# Patient Record
Sex: Female | Born: 1970 | Race: White | Hispanic: No | Smoking: Former smoker
Health system: Southern US, Community
[De-identification: ages and names within clinical notes are randomized; demographics above are authoritative.]

## PROBLEM LIST (undated history)

## (undated) DIAGNOSIS — M1990 Unspecified osteoarthritis, unspecified site: Secondary | ICD-10-CM

## (undated) DIAGNOSIS — M069 Rheumatoid arthritis, unspecified: Secondary | ICD-10-CM

## (undated) HISTORY — PX: ABLATION: SHX5711

## (undated) HISTORY — DX: Rheumatoid arthritis, unspecified: M06.9

## (undated) HISTORY — DX: Unspecified osteoarthritis, unspecified site: M19.90

---

## 2015-01-10 ENCOUNTER — Ambulatory Visit: Payer: Self-pay | Admitting: Internal Medicine

## 2015-07-23 ENCOUNTER — Other Ambulatory Visit (HOSPITAL_COMMUNITY): Payer: Self-pay | Admitting: Rheumatology

## 2015-07-23 ENCOUNTER — Ambulatory Visit (HOSPITAL_COMMUNITY)
Admission: RE | Admit: 2015-07-23 | Discharge: 2015-07-23 | Disposition: A | Discharge status: Home / Self Care | Payer: PRIVATE HEALTH INSURANCE | Source: Ambulatory Visit | Attending: Rheumatology | Admitting: Rheumatology

## 2015-07-23 DIAGNOSIS — Z9225 Personal history of immunosupression therapy: Secondary | ICD-10-CM

## 2016-08-17 ENCOUNTER — Telehealth: Payer: Self-pay | Admitting: Radiology

## 2016-08-17 DIAGNOSIS — M0609 Rheumatoid arthritis without rheumatoid factor, multiple sites: Secondary | ICD-10-CM

## 2016-08-17 MED ORDER — ADALIMUMAB 40 MG/0.8ML ~~LOC~~ AJKT: 1.0000 "pen " | AUTO-INJECTOR | SUBCUTANEOUS | 2 refills | Status: DC

## 2016-08-17 NOTE — Telephone Encounter (Signed)
Last visit 05/05/16 Next visit 10/05/17 Labs 07/24/16 TB neg 07/24/16 Ok to refill Humira per SD

## 2016-08-17 NOTE — Telephone Encounter (Signed)
Refill request received via fax for Humira

## 2016-10-01 ENCOUNTER — Other Ambulatory Visit: Payer: Self-pay | Admitting: *Deleted

## 2016-10-01 DIAGNOSIS — M0609 Rheumatoid arthritis without rheumatoid factor, multiple sites: Secondary | ICD-10-CM

## 2016-10-01 DIAGNOSIS — Z79899 Other long term (current) drug therapy: Secondary | ICD-10-CM | POA: Insufficient documentation

## 2016-10-01 DIAGNOSIS — M722 Plantar fascial fibromatosis: Secondary | ICD-10-CM | POA: Insufficient documentation

## 2016-10-01 DIAGNOSIS — E559 Vitamin D deficiency, unspecified: Secondary | ICD-10-CM | POA: Insufficient documentation

## 2016-10-01 MED ORDER — ADALIMUMAB 40 MG/0.8ML ~~LOC~~ AJKT: 1.0000 "pen " | AUTO-INJECTOR | SUBCUTANEOUS | 2 refills | Status: DC

## 2016-10-01 NOTE — Telephone Encounter (Signed)
ok 

## 2016-10-01 NOTE — Telephone Encounter (Signed)
Prescription refill request received via fax for Humira  Last visit 05/05/16 Next visit 10/05/17 Labs 07/24/16 TB neg 07/24/16  Okay to refill Humira?

## 2016-10-05 ENCOUNTER — Encounter: Payer: Self-pay | Admitting: Rheumatology

## 2016-10-05 ENCOUNTER — Ambulatory Visit (INDEPENDENT_AMBULATORY_CARE_PROVIDER_SITE_OTHER): Payer: PRIVATE HEALTH INSURANCE | Admitting: Rheumatology

## 2016-10-05 VITALS — BP 110/60 | HR 78 | Ht 61.0 in | Wt 128.0 lb

## 2016-10-05 DIAGNOSIS — M722 Plantar fascial fibromatosis: Secondary | ICD-10-CM

## 2016-10-05 DIAGNOSIS — Z79899 Other long term (current) drug therapy: Secondary | ICD-10-CM | POA: Diagnosis not present

## 2016-10-05 DIAGNOSIS — M25511 Pain in right shoulder: Secondary | ICD-10-CM

## 2016-10-05 DIAGNOSIS — M0609 Rheumatoid arthritis without rheumatoid factor, multiple sites: Secondary | ICD-10-CM

## 2016-10-05 DIAGNOSIS — E559 Vitamin D deficiency, unspecified: Secondary | ICD-10-CM | POA: Diagnosis not present

## 2016-10-05 DIAGNOSIS — M25571 Pain in right ankle and joints of right foot: Secondary | ICD-10-CM | POA: Diagnosis not present

## 2016-10-05 MED ORDER — LIDOCAINE HCL 1 % IJ SOLN
1.0000 mL | INTRAMUSCULAR | Status: AC | PRN
  Administered 2016-10-05: 1 mL

## 2016-10-05 MED ORDER — DICLOFENAC SODIUM 1 % TD GEL: TRANSDERMAL | 3 refills | Status: DC

## 2016-10-05 MED ORDER — TRIAMCINOLONE ACETONIDE 40 MG/ML IJ SUSP
40.0000 mg | INTRAMUSCULAR | Status: AC | PRN
  Administered 2016-10-05: 40 mg via INTRA_ARTICULAR

## 2016-10-05 MED ORDER — METHOTREXATE 2.5 MG PO TABS
17.5000 mg | ORAL_TABLET | ORAL | 0 refills | Status: DC
Start: 2016-10-05 — End: 2017-01-03

## 2016-10-05 MED ORDER — ADALIMUMAB 40 MG/0.8ML ~~LOC~~ AJKT: 1.0000 "pen " | AUTO-INJECTOR | SUBCUTANEOUS | 0 refills | Status: DC

## 2016-10-05 NOTE — Patient Instructions (Addendum)
===============  #5 appointment for ultrasound of the bilateral feet and ankles with focus on the right lateral ankle secondary to ongoing chronic pain to the right lateral ankle joint.  ===================  Standing Labs We placed an order today for your standing lab work.    Please come back and get your standing labs in 2 weeks , then q 3 months.  We have open lab Monday through Friday from 8:30-11:30 AM and 1:30-4 PM at the office of Dr. Arbutus Ped, PA.   The office is located at [address], [address], [place], [place] [address] No appointment is necessary.   Labs are drawn by [facility].  You may receive a bill from [facility] for your lab work.   =====================   Shoulder Exercises Ask your health care provider which exercises are safe for you. Do exercises exactly as told by your health care provider and adjust them as directed. It is normal to feel mild stretching, pulling, tightness, or discomfort as you do these exercises, but you should stop right away if you feel sudden pain or your pain gets worse.Do not begin these exercises until told by your health care provider. RANGE OF MOTION EXERCISES  These exercises warm up your muscles and joints and improve the movement and flexibility of your shoulder. These exercises also help to relieve pain, numbness, and tingling. These exercises involve stretching your injured shoulder directly. Exercise A: Pendulum  1. Stand near a wall or a surface that you can hold onto for balance. 2. Bend at the waist and let your left / right arm hang straight down. Use your other arm to support you. Keep your back straight and do not lock your knees. 3. Relax your left / right arm and shoulder muscles, and move your hips and your trunk so your left / right arm swings freely. Your arm should swing because of the motion of your body, not because you are using your arm or shoulder muscles. 4. Keep moving your body so your arm  swings in the following directions, as told by your health care provider:  Side to side.  Forward and backward.  In clockwise and counterclockwise circles. 5. Continue each motion for __________ seconds, or for as long as told by your health care provider. 6. Slowly return to the starting position. Repeat __________ times. Complete this exercise __________ times a day. Exercise B:Flexion, Standing  1. Stand and hold a broomstick, a cane, or a similar object. Place your hands a little more than shoulder-width apart on the object. Your left / right hand should be palm-up, and your other hand should be palm-down. 2. Keep your elbow straight and keep your shoulder muscles relaxed. Push the stick down with your healthy arm to raise your left / right arm in front of your body, and then over your head until you feel a stretch in your shoulder.  Avoid shrugging your shoulder while you raise your arm. Keep your shoulder blade tucked down toward the middle of your back. 3. Hold for __________ seconds. 4. Slowly return to the starting position. Repeat __________ times. Complete this exercise __________ times a day. Exercise C: Abduction, Standing 1. Stand and hold a broomstick, a cane, or a similar object. Place your hands a little more than shoulder-width apart on the object. Your left / right hand should be palm-up, and your other hand should be palm-down. 2. While keeping your elbow straight and your shoulder muscles relaxed, push the stick across your body  toward your left / right side. Raise your left / right arm to the side of your body and then over your head until you feel a stretch in your shoulder.  Do not raise your arm above shoulder height, unless your health care provider tells you to do that.  Avoid shrugging your shoulder while you raise your arm. Keep your shoulder blade tucked down toward the middle of your back. 3. Hold for __________ seconds. 4. Slowly return to the starting  position. Repeat __________ times. Complete this exercise __________ times a day. Exercise D:Internal Rotation  1. Place your left / right hand behind your back, palm-up. 2. Use your other hand to dangle an exercise band, a towel, or a similar object over your shoulder. Grasp the band with your left / right hand so you are holding onto both ends. 3. Gently pull up on the band until you feel a stretch in the front of your left / right shoulder.  Avoid shrugging your shoulder while you raise your arm. Keep your shoulder blade tucked down toward the middle of your back. 4. Hold for __________ seconds. 5. Release the stretch by letting go of the band and lowering your hands. Repeat __________ times. Complete this exercise __________ times a day. STRETCHING EXERCISES  These exercises warm up your muscles and joints and improve the movement and flexibility of your shoulder. These exercises also help to relieve pain, numbness, and tingling. These exercises are done using your healthy shoulder to help stretch the muscles of your injured shoulder. Exercise E: Research officer, political partyCorner Stretch (External Rotation and Abduction)  1. Stand in a doorway with one of your feet slightly in front of the other. This is called a staggered stance. If you cannot reach your forearms to the door frame, stand facing a corner of a room. 2. Choose one of the following positions as told by your health care provider:  Place your hands and forearms on the door frame above your head.  Place your hands and forearms on the door frame at the height of your head.  Place your hands on the door frame at the height of your elbows. 3. Slowly move your weight onto your front foot until you feel a stretch across your chest and in the front of your shoulders. Keep your head and chest upright and keep your abdominal muscles tight. 4. Hold for __________ seconds. 5. To release the stretch, shift your weight to your back foot. Repeat __________ times.  Complete this stretch __________ times a day. Exercise F:Extension, Standing 1. Stand and hold a broomstick, a cane, or a similar object behind your back.  Your hands should be a little wider than shoulder-width apart.  Your palms should face away from your back. 2. Keeping your elbows straight and keeping your shoulder muscles relaxed, move the stick away from your body until you feel a stretch in your shoulder.  Avoid shrugging your shoulders while you move the stick. Keep your shoulder blade tucked down toward the middle of your back. 3. Hold for __________ seconds. 4. Slowly return to the starting position. Repeat __________ times. Complete this exercise __________ times a day. STRENGTHENING EXERCISES  These exercises build strength and endurance in your shoulder. Endurance is the ability to use your muscles for a long time, even after they get tired. Exercise G:External Rotation  1. Sit in a stable chair without armrests. 2. Secure an exercise band at elbow height on your left / right side. 3. Place a soft  object, such as a folded towel or a small pillow, between your left / right upper arm and your body to move your elbow a few inches away (about 10 cm) from your side. 4. Hold the end of the band so it is tight and there is no slack. 5. Keeping your elbow pressed against the soft object, move your left / right forearm out, away from your abdomen. Keep your body steady so only your forearm moves. 6. Hold for __________ seconds. 7. Slowly return to the starting position. Repeat __________ times. Complete this exercise __________ times a day. Exercise H:Shoulder Abduction  1. Sit in a stable chair without armrests, or stand. 2. Hold a __________ weight in your left / right hand, or hold an exercise band with both hands. 3. Start with your arms straight down and your left / right palm facing in, toward your body. 4. Slowly lift your left / right hand out to your side. Do not lift  your hand above shoulder height unless your health care provider tells you that this is safe.  Keep your arms straight.  Avoid shrugging your shoulder while you do this movement. Keep your shoulder blade tucked down toward the middle of your back. 5. Hold for __________ seconds. 6. Slowly lower your arm, and return to the starting position. Repeat __________ times. Complete this exercise __________ times a day. Exercise I:Shoulder Extension 1. Sit in a stable chair without armrests, or stand. 2. Secure an exercise band to a stable object in front of you where it is at shoulder height. 3. Hold one end of the exercise band in each hand. Your palms should face each other. 4. Straighten your elbows and lift your hands up to shoulder height. 5. Step back, away from the secured end of the exercise band, until the band is tight and there is no slack. 6. Squeeze your shoulder blades together as you pull your hands down to the sides of your thighs. Stop when your hands are straight down by your sides. Do not let your hands go behind your body. 7. Hold for __________ seconds. 8. Slowly return to the starting position. Repeat __________ times. Complete this exercise __________ times a day. Exercise J:Standing Shoulder Row 1. Sit in a stable chair without armrests, or stand. 2. Secure an exercise band to a stable object in front of you so it is at waist height. 3. Hold one end of the exercise band in each hand. Your palms should be in a thumbs-up position. 4. Bend each of your elbows to an "L" shape (about 90 degrees) and keep your upper arms at your sides. 5. Step back until the band is tight and there is no slack. 6. Slowly pull your elbows back behind you. 7. Hold for __________ seconds. 8. Slowly return to the starting position. Repeat __________ times. Complete this exercise __________ times a day. Exercise K:Shoulder Press-Ups  1. Sit in a stable chair that has armrests. Sit upright, with  your feet flat on the floor. 2. Put your hands on the armrests so your elbows are bent and your fingers are pointing forward. Your hands should be about even with the sides of your body. 3. Push down on the armrests and use your arms to lift yourself off of the chair. Straighten your elbows and lift yourself up as much as you comfortably can.  Move your shoulder blades down, and avoid letting your shoulders move up toward your ears.  Keep your feet on the  ground. As you get stronger, your feet should support less of your body weight as you lift yourself up. 4. Hold for __________ seconds. 5. Slowly lower yourself back into the chair. Repeat __________ times. Complete this exercise __________ times a day. Exercise L: Wall Push-Ups  1. Stand so you are facing a stable wall. Your feet should be about one arm-length away from the wall. 2. Lean forward and place your palms on the wall at shoulder height. 3. Keep your feet flat on the floor as you bend your elbows and lean forward toward the wall. 4. Hold for __________ seconds. 5. Straighten your elbows to push yourself back to the starting position. Repeat __________ times. Complete this exercise __________ times a day. This information is not intended to replace advice given to you by your health care provider. Make sure you discuss any questions you have with your health care provider. Document Released: 08/19/2005 Document Revised: 06/29/2016 Document Reviewed: 06/16/2015 Elsevier Interactive Patient Education  2017 [employer]

## 2016-10-05 NOTE — Progress Notes (Signed)
Office Visit Note  Patient: Sheena Rivera             Date of Birth: 07/30/1971           MRN: [id number]             PCP: No primary care provider on file. Referring: No ref. provider found Visit Date: 10/05/2016 Occupation: _0 @    Subjective:  Pain of the Right Shoulder and Pain of the Right Foot   History of Present Illness: Sheena Rivera is a 45 y.o. female  Last seen 05/05/2016 Doing well with the seronegative rheumatoid arthritis overall with no joint pain swelling or stiffness except right lateral ankle joint with ongoing pain (chronically).  For the last 2 weeks she's been having pain to the right shoulder joint. She has decreased range of motion. She is able to raise her right shoulder joint with the use of her left arm. But when asked to do it without the left arm assistance, patient is having trouble. It feels like a "catch" in the right shoulder joint.  She is taking her medications as prescribed which includes Humira every 2 weeks and methotrexate 6 per week. I wonder if increasing the methotrexate to 7 per week. Her right malleolar problems  I will schedule ultrasound for the patient in 3 months so we can verify that there is no synovitis going on but in the meanwhile we'll try to see if 7 pills of methotrexate helps the patient result. She's been asked to monitor this.  She had a history of vitamin D deficiency in the past but that is not resolving with October 2017 labs. Her vitamin D level was 68. CBC with differential CMP with GFR in October also normal.  Return to clinic in 5 months for regular follow-up with blood work to be done every 3 months starting 2 weeks from now since were increasing the methotrexate and then every 3 months thereafter and she'll come in for ultrasound of the bilateral feet and ankles with focus on the right ankles in about 3 months with Dr. Estanislado Pandy   Activities of Daily Living:  Patient reports morning stiffness for 15 minutes.     Patient Reports nocturnal pain.  Difficulty dressing/grooming: Reports Difficulty climbing stairs: Reports Difficulty getting out of chair: Denies Difficulty using hands for taps, buttons, cutlery, and/or writing: Denies   Review of Systems  Constitutional: Negative for fatigue.  HENT: Negative for mouth sores and mouth dryness.   Eyes: Negative for dryness.  Respiratory: Negative for shortness of breath.   Gastrointestinal: Negative for constipation and diarrhea.  Musculoskeletal: Positive for arthralgias (right sj pain w/ decr. range of motion) and joint pain (right sj pain w/ decr. range of motion). Negative for myalgias and myalgias.  Skin: Negative for sensitivity to sunlight.  Psychiatric/Behavioral: Negative for decreased concentration and sleep disturbance.    PMFS History:  Patient Active Problem List   Diagnosis Date Noted  . 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 High risk medication use 10/01/2016  . Vitamin D deficiency 10/01/2016  . Plantar fasciitis 10/01/2016    No past medical history on file.  No family history on file. No past surgical history on file. Social History   Social History Narrative  . No narrative on file     Objective: Vital Signs: BP 110/60   Pulse 78   Ht 5' 1" (1.549 m)   Wt 128 lb (58.1 kg)   BMI 24.19  kg/m    Physical Exam  Constitutional: She is oriented to person, place, and time. She appears well-developed and well-nourished.  HENT:  Head: Normocephalic and atraumatic.  Eyes: EOM are normal. Pupils are equal, round, and reactive to light.  Cardiovascular: Normal rate, regular rhythm and normal heart sounds.  Exam reveals no gallop and no friction rub.   No murmur heard. Pulmonary/Chest: Effort normal and breath sounds normal. She has no wheezes. She has no rales.  Abdominal: Soft. Bowel sounds are normal. She exhibits no distension. There is no tenderness. There is no guarding.  No hernia.  Musculoskeletal: Normal range of motion. She exhibits no edema, tenderness or deformity.  Lymphadenopathy:    She has no cervical adenopathy.  Neurological: She is alert and oriented to person, place, and time. Coordination normal.  Skin: Skin is warm and dry. Capillary refill takes less than 2 seconds. No rash noted.  Psychiatric: She has a normal mood and affect. Her behavior is normal.     Musculoskeletal Exam:  Full range of motion of all joints except decreased range of motion of right shoulder joint. Grip strength is equal and strong bilaterally Fiber myalgia tender points are all absent  CDAI Exam: CDAI Homunculus Exam:   Tenderness:  RLE: tibiotalar  Joint Counts:  CDAI Tender Joint count: 0 CDAI Swollen Joint count: 0     Investigation: No additional findings.   Imaging: No results found.  Speciality Comments: No specialty comments available.    Procedures:  Large Joint Inj Date/Time: 10/05/2016 10:38 AM Performed by: ,  Authorized by: ,    Consent Given by:  Patient Site marked: the procedure site was marked   Timeout: prior to procedure the correct patient, procedure, and site was verified   Indications:  Pain Location:  Shoulder Site:  R glenohumeral Prep: patient was prepped and draped in usual sterile fashion   Needle Size:  27 G Needle Length:  1.5 inches Approach:  Posterior Ultrasound Guidance: No   Fluoroscopic Guidance: No   Arthrogram: No   Medications:  1 mL lidocaine 1 %; 40 mg triamcinolone acetonide 40 MG/ML Aspiration Attempted: Yes   Aspirate amount (mL):  0 Patient tolerance:  Patient tolerated the procedure well with no immediate complications   Allergies: Patient has no known allergies.   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 There is also some synovial thickening of the right second MCP joint but no active tenderness. At times it does get painful and tender. This could be old disease versus current active disease. Were increasing the methotrexate from 6 per  week to 7 per week. Note that patient has had a history of elevation of kidney function as a result of higher levels of methotrexate. In couple of weks, we will recheck kidney funcion.  ===================  Plan: #1: Right shoulder joint is hurting consistent with bursitis. After informed consent was obtained the right shoulder joint was prepped in usual sterile fashion injected with 40 mg of Kenalog mixed with 1   mg 1% lidocaine. Patient tolerated procedure well. There are no complications #2 due to the ongoing right malleolus pain on the lateral aspect, I suspect that he could be poorly controlled RA just in that area and we've increased her methotrexate to 7 per week. Please see above for full details. #3 we'll recheck her CBC with differential CMP with GFR in 2 weeks to make sure that it does not affect her kidney function. #4 FOR Voltaren gel to apply to the lateral malleolus area. We discussed in detail how to use the medication. #5 appointment for ultrasound of the bilateral feet and ankles with focus on the right lateral ankle secondary to ongoing chronic pain to the right lateral ankle joint. In 3 months    Orders: Orders Placed This Encounter  Procedures  . Large Joint Injection/Arthrocentesis  . CBC with Differential/Platelet  . Comprehensive metabolic panel   Meds ordered this encounter  Medications   . Adalimumab (HUMIRA PEN) 40 MG/0.8ML PNKT    Sig: Inject 1 pen into the skin every 14 (fourteen) days. 2 pens/ one kit    Dispense:  3 each    Refill:  0  . methotrexate (RHEUMATREX) 2.5 MG tablet    Sig: Take 7 tablets (17.5 mg total) by mouth once a week.    Dispense:  84 tablet    Refill:  0    Order Specific Question:   Supervising Provider    Answer:   DEVESHWAR, SHAILI [[id number]]  . diclofenac sodium (VOLTAREN) 1 % GEL    Sig: Voltaren Gel 3 grams to 3 large joints upto TID 3 TUBES with 3 refills    Dispense:  3 Tube    Refill:  3    Voltaren Gel 3 grams to 3 large joints upto TID 3 TUBES with 3 refills    Order Specific Question:   Supervising Provider    Answer:   DEVESHWAR, SHAILI [[id number]]    Face-to-face time spent with patient was 30 minutes. 50% of time was spent in counseling and coordination of care.  Follow-Up Instructions: Return in about 5 months (around 03/05/2017) for RA,humira q 2wks, mtx 6/wk; rt. sj bursitis; rt ankle jt pain.    , PA-C    

## 2016-10-21 ENCOUNTER — Telehealth: Payer: Self-pay | Admitting: Pharmacist

## 2016-10-21 NOTE — Telephone Encounter (Signed)
Received fax from [facility] with notice of approval for Humira.  Medication is approved through 10/09/2018.  Case number:  [id number].  Will scan approval into patient's chart.  Attempt to contact patient to inform her.  THere was no answer.

## 2016-10-22 NOTE — Telephone Encounter (Signed)
Received return phone call from patient.  Advised her that her Humira was approved through 10/09/2018.  She denied any questions at this time.   Lilla Shookachel Henderson, Pharm.D., BCPS Clinical Pharmacist Pager: [phone] Phone: [phone] 10/22/2016 1:40 PM

## 2016-10-22 NOTE — Telephone Encounter (Signed)
Second attempt to reach patient.  I left a HIPPA compliant message for patient.     Lilla Shookachel Henderson, Pharm.D., BCPS Clinical Pharmacist Pager: [phone] Phone: [phone] 10/22/2016 8:21 AM

## 2016-10-23 ENCOUNTER — Other Ambulatory Visit: Payer: Self-pay | Admitting: *Deleted

## 2016-10-23 DIAGNOSIS — Z79899 Other long term (current) drug therapy: Secondary | ICD-10-CM

## 2016-10-23 LAB — COMPREHENSIVE METABOLIC PANEL
ALK PHOS: 53 U/L (ref 33–115)
ALT: 13 U/L (ref 6–29)
AST: 20 U/L (ref 10–35)
Albumin: 4.4 g/dL (ref 3.6–5.1)
BILIRUBIN TOTAL: 1.1 mg/dL (ref 0.2–1.2)
BUN: 14 mg/dL (ref 7–25)
CO2: 25 mmol/L (ref 20–31)
CREATININE: 0.65 mg/dL (ref 0.50–1.10)
Calcium: 9.3 mg/dL (ref 8.6–10.2)
Chloride: 103 mmol/L (ref 98–110)
GLUCOSE: 120 mg/dL — AB (ref 65–99)
Potassium: 4.7 mmol/L (ref 3.5–5.3)
SODIUM: 138 mmol/L (ref 135–146)
TOTAL PROTEIN: 6.7 g/dL (ref 6.1–8.1)

## 2016-10-23 LAB — CBC WITH DIFFERENTIAL/PLATELET
BASOS PCT: 0 %
Basophils Absolute: 0 cells/uL (ref 0–200)
EOS ABS: 222 {cells}/uL (ref 15–500)
EOS PCT: 3 %
HCT: 42.9 % (ref 35.0–45.0)
Hemoglobin: 14.7 g/dL (ref 11.7–15.5)
LYMPHS PCT: 25 %
Lymphs Abs: 1850 cells/uL (ref 850–3900)
MCH: 29.6 pg (ref 27.0–33.0)
MCHC: 34.3 g/dL (ref 32.0–36.0)
MCV: 86.5 fL (ref 80.0–100.0)
MONOS PCT: 7 %
MPV: 9.8 fL (ref 7.5–12.5)
Monocytes Absolute: 518 cells/uL (ref 200–950)
NEUTROS ABS: 4810 {cells}/uL (ref 1500–7800)
Neutrophils Relative %: 65 %
PLATELETS: 280 10*3/uL (ref 140–400)
RBC: 4.96 MIL/uL (ref 3.80–5.10)
RDW: 14.6 % (ref 11.0–15.0)
WBC: 7.4 10*3/uL (ref 3.8–10.8)

## 2016-10-26 NOTE — Progress Notes (Signed)
CMP with GFR is normal except nonfasting glucose elevated at 120.CBC with differential is normalPlease send copy of these labs to PCP and notify patient of the results

## 2016-12-28 NOTE — Progress Notes (Signed)
Patient with history of seronegative rheumatoid arthritis. She is scheduled for ultrasound of bilateral feet and ankles to rule out synovitis.

## 2016-12-30 ENCOUNTER — Other Ambulatory Visit: Payer: Self-pay | Admitting: *Deleted

## 2016-12-30 ENCOUNTER — Inpatient Hospital Stay (INDEPENDENT_AMBULATORY_CARE_PROVIDER_SITE_OTHER): Payer: Self-pay

## 2016-12-30 ENCOUNTER — Ambulatory Visit (INDEPENDENT_AMBULATORY_CARE_PROVIDER_SITE_OTHER): Payer: PRIVATE HEALTH INSURANCE | Admitting: Rheumatology

## 2016-12-30 ENCOUNTER — Inpatient Hospital Stay (INDEPENDENT_AMBULATORY_CARE_PROVIDER_SITE_OTHER): Payer: PRIVATE HEALTH INSURANCE

## 2016-12-30 DIAGNOSIS — M79672 Pain in left foot: Secondary | ICD-10-CM

## 2016-12-30 DIAGNOSIS — M0609 Rheumatoid arthritis without rheumatoid factor, multiple sites: Secondary | ICD-10-CM

## 2016-12-30 DIAGNOSIS — M79671 Pain in right foot: Secondary | ICD-10-CM

## 2016-12-30 DIAGNOSIS — M778 Other enthesopathies, not elsewhere classified: Secondary | ICD-10-CM

## 2016-12-30 DIAGNOSIS — M7581 Other shoulder lesions, right shoulder: Secondary | ICD-10-CM

## 2016-12-30 MED ORDER — ADALIMUMAB 40 MG/0.8ML ~~LOC~~ AJKT: 1.0000 "pen " | AUTO-INJECTOR | SUBCUTANEOUS | 0 refills | Status: DC

## 2016-12-30 NOTE — Telephone Encounter (Signed)
Refill request received via fax.   Last Visit: 10/05/16 Next Visit: 03/05/17 Labs: 10/23/16 Elevated glucose TB Gold: 07/24/16  Okay to refill Humira?

## 2017-03-05 ENCOUNTER — Ambulatory Visit: Payer: PRIVATE HEALTH INSURANCE | Admitting: Rheumatology

## 2017-03-12 NOTE — Progress Notes (Signed)
Office Visit Note  Patient: Sheena Rivera             Date of Birth: 04/24/1971           MRN: [id number]             PCP: Patient, No Pcp Per Referring: No ref. provider found Visit Date: 03/23/2017 Occupation: _0 @    Subjective:  Medication Management   History of Present Illness: Sheena Rivera is a 46 y.o. female  Seen 12/30/2016 by Dr. Estanislado Pandy for ultrasound of bilateral feet and ankles to rule out synovitis (review of office notes shows no synovitis noted on ultrasound.); and seen by me 10/05/2016 for office visit.  Currently on Humira every 2 weeks, methotrexate 7 pills per week and folic acid 2 mg per day Adequate response.  Note that back in 10/05/2016 visit, we had to increase the patient's methotrexate from 6 per week to 7 per week because she was having flares in her right second MCP joint    Activities of Daily Living:  Patient reports morning stiffness for 15 minutes.   Patient Denies nocturnal pain.  Difficulty dressing/grooming: Denies Difficulty climbing stairs: Denies Difficulty getting out of chair: Denies Difficulty using hands for taps, buttons, cutlery, and/or writing: Denies   Review of Systems  Constitutional: Negative for fatigue.  HENT: Negative for mouth sores and mouth dryness.   Eyes: Negative for dryness.  Respiratory: Negative for shortness of breath.   Gastrointestinal: Negative for constipation and diarrhea.  Musculoskeletal: Negative for myalgias and myalgias.  Skin: Negative for sensitivity to sunlight.  Psychiatric/Behavioral: Negative for decreased concentration and sleep disturbance.    PMFS History:  Patient Active Problem List   Diagnosis Date Noted  . 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 High risk medication use 10/01/2016  . Vitamin D deficiency 10/01/2016  . Plantar fasciitis 10/01/2016    Past Medical History:  Diagnosis Date  . Arthritis     No family history on  file. History reviewed. No pertinent surgical history. Social History   Social History Narrative  . No narrative on file     Objective: Vital Signs: BP 120/62   Pulse 78   Resp 14   Wt 130 lb (59 kg)   BMI 24.56 kg/m    Physical Exam  Constitutional: She is oriented to person, place, and time. She appears well-developed and well-nourished.  HENT:  Head: Normocephalic and atraumatic.  Eyes: EOM are normal. Pupils are equal, round, and reactive to light.  Cardiovascular: Normal rate, regular rhythm and normal heart sounds.  Exam reveals no gallop and no friction rub.   No murmur heard. Pulmonary/Chest: Effort normal and breath sounds normal. She has no wheezes. She has no rales.  Abdominal: Soft. Bowel sounds are normal. She exhibits no distension. There is no tenderness. There is no guarding. No hernia.  Musculoskeletal: Normal range of motion. She exhibits no edema, tenderness or deformity.  Lymphadenopathy:    She has no cervical adenopathy.  Neurological: She is alert and oriented to person, place, and time. Coordination normal.  Skin: Skin is warm and dry. Capillary refill takes less than 2 seconds. No rash noted.  Psychiatric: She has a normal mood and affect. Her behavior is normal.  Nursing note and vitals reviewed.    Musculoskeletal Exam:  Full range of motion of all joints Grip strength is equal and strong bilaterally Fiber myalgia tender points are all absent  CDAI Exam: CDAI Homunculus  Exam:   Joint Counts:  CDAI Tender Joint count: 0 CDAI Swollen Joint count: 0  Global Assessments:  Patient Global Assessment: 1 Provider Global Assessment: 1  CDAI Calculated Score: 2  No synovitis on examination  Investigation: No additional findings.  Orders Only on 10/23/2016  Component Date Value Ref Range Status  . WBC 10/23/2016 7.4  3.8 - 10.8 K/uL Final  . RBC 10/23/2016 4.96  3.80 - 5.10 MIL/uL Final  . Hemoglobin 10/23/2016 14.7  11.7 - 15.5 g/dL Final   . HCT 10/23/2016 42.9  35.0 - 45.0 % Final  . MCV 10/23/2016 86.5  80.0 - 100.0 fL Final  . MCH 10/23/2016 29.6  27.0 - 33.0 pg Final  . MCHC 10/23/2016 34.3  32.0 - 36.0 g/dL Final  . RDW 10/23/2016 14.6  11.0 - 15.0 % Final  . Platelets 10/23/2016 280  140 - 400 K/uL Final  . MPV 10/23/2016 9.8  7.5 - 12.5 fL Final  . Neutro Abs 10/23/2016 4810  1,500 - 7,800 cells/uL Final  . Lymphs Abs 10/23/2016 1850  850 - 3,900 cells/uL Final  . Monocytes Absolute 10/23/2016 518  200 - 950 cells/uL Final  . Eosinophils Absolute 10/23/2016 222  15 - 500 cells/uL Final  . Basophils Absolute 10/23/2016 0  0 - 200 cells/uL Final  . Neutrophils Relative % 10/23/2016 65  % Final  . Lymphocytes Relative 10/23/2016 25  % Final  . Monocytes Relative 10/23/2016 7  % Final  . Eosinophils Relative 10/23/2016 3  % Final  . Basophils Relative 10/23/2016 0  % Final  . Smear Review 10/23/2016 Criteria for review not met   Final  . Sodium 10/23/2016 138  135 - 146 mmol/L Final  . Potassium 10/23/2016 4.7  3.5 - 5.3 mmol/L Final  . Chloride 10/23/2016 103  98 - 110 mmol/L Final  . CO2 10/23/2016 25  20 - 31 mmol/L Final  . Glucose, Bld 10/23/2016 120* 65 - 99 mg/dL Final  . BUN 10/23/2016 14  7 - 25 mg/dL Final  . Creat 10/23/2016 0.65  0.50 - 1.10 mg/dL Final  . Total Bilirubin 10/23/2016 1.1  0.2 - 1.2 mg/dL Final  . Alkaline Phosphatase 10/23/2016 53  33 - 115 U/L Final  . AST 10/23/2016 20  10 - 35 U/L Final  . ALT 10/23/2016 13  6 - 29 U/L Final  . Total Protein 10/23/2016 6.7  6.1 - 8.1 g/dL Final  . Albumin 10/23/2016 4.4  3.6 - 5.1 g/dL Final  . Calcium 10/23/2016 9.3  8.6 - 10.2 mg/dL Final     Imaging: No results found.  Speciality Comments: No specialty comments available.    Procedures:  No procedures performed Allergies: Patient has no known allergies.   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 Patient did not do well when she was on 6 pills per week back in December 2017 and we had to increase her to 7 pills per week and patient is doing  well and no flares.; TB gold negative 07/24/2016  Lab order placed for TB Gold do September/October 2018===>Patient is due for repeat TB gold in October 2018; her labs will be due September 2018. It is fine for patient to get TB gold in September when she gets her CBC with differential and CMP with GFR done in September.  #3: History of vitamin D deficiency; updated vitamin D done 07/24/2016 shows level of 68 (desirable range). History of low vitamin D so we will recheck it today. Patient is taking vitamin D supplements from over-the-counter  #4: History of plantar fasciitis. Doing well. No complaint.  Orders: Orders Placed This Encounter  Procedures  . Quantiferon tb gold assay (blood)  . VITAMIN D 25 Hydroxy (Vit-D Deficiency, Fractures)   Meds ordered this encounter  Medications  . methotrexate (RHEUMATREX) 2.5 MG tablet    Sig: Take 7 tablets (17.5 mg total) by mouth once a week. Caution:Chemotherapy. Protect from light.    Dispense:  84 tablet    Refill:  0    Order Specific Question:   Supervising Provider    Answer:   Bo Merino [phone]    Face-to-face time spent with patient was 30 minutes. 50% of time was spent in counseling and coordination of care.  Follow-Up Instructions: Return in about 5 months (around 08/23/2017).   Eliezer Lofts, PA-C    Patient had no synovitis on my exam. I examined and evaluated the patient with Eliezer Lofts PA. The plan of care was discussed as noted above.  Bo Merino, MD Note - This record has been created using Editor, commissioning.  Chart creation errors have been sought, but may not always  have been located. Such creation errors do not reflect on  the standard of medical care.

## 2017-03-12 NOTE — Assessment & Plan Note (Signed)
03/23/2017: Seronegative rheumatoid arthritis. (On 05/05/2016: No joint pain swelling or stiffness).

## 2017-03-23 ENCOUNTER — Encounter: Payer: Self-pay | Admitting: Rheumatology

## 2017-03-23 ENCOUNTER — Encounter (INDEPENDENT_AMBULATORY_CARE_PROVIDER_SITE_OTHER): Payer: Self-pay

## 2017-03-23 ENCOUNTER — Ambulatory Visit (INDEPENDENT_AMBULATORY_CARE_PROVIDER_SITE_OTHER): Payer: PRIVATE HEALTH INSURANCE | Admitting: Rheumatology

## 2017-03-23 VITALS — BP 120/62 | HR 78 | Resp 14 | Wt 130.0 lb

## 2017-03-23 DIAGNOSIS — M722 Plantar fascial fibromatosis: Secondary | ICD-10-CM | POA: Diagnosis not present

## 2017-03-23 DIAGNOSIS — Z79899 Other long term (current) drug therapy: Secondary | ICD-10-CM | POA: Diagnosis not present

## 2017-03-23 DIAGNOSIS — M0609 Rheumatoid arthritis without rheumatoid factor, multiple sites: Secondary | ICD-10-CM

## 2017-03-23 DIAGNOSIS — E559 Vitamin D deficiency, unspecified: Secondary | ICD-10-CM

## 2017-03-23 LAB — CBC WITH DIFFERENTIAL/PLATELET
BASOS ABS: 95 {cells}/uL (ref 0–200)
Basophils Relative: 1 %
EOS ABS: 570 {cells}/uL — AB (ref 15–500)
Eosinophils Relative: 6 %
HEMATOCRIT: 44.8 % (ref 35.0–45.0)
Hemoglobin: 15 g/dL (ref 11.7–15.5)
LYMPHS PCT: 25 %
Lymphs Abs: 2375 cells/uL (ref 850–3900)
MCH: 29.6 pg (ref 27.0–33.0)
MCHC: 33.5 g/dL (ref 32.0–36.0)
MCV: 88.4 fL (ref 80.0–100.0)
MONOS PCT: 5 %
MPV: 10.7 fL (ref 7.5–12.5)
Monocytes Absolute: 475 cells/uL (ref 200–950)
NEUTROS PCT: 63 %
Neutro Abs: 5985 cells/uL (ref 1500–7800)
PLATELETS: 323 10*3/uL (ref 140–400)
RBC: 5.07 MIL/uL (ref 3.80–5.10)
RDW: 13 % (ref 11.0–15.0)
WBC: 9.5 10*3/uL (ref 3.8–10.8)

## 2017-03-23 MED ORDER — METHOTREXATE 2.5 MG PO TABS: 17.5000 mg | ORAL_TABLET | ORAL | 0 refills | Status: DC

## 2017-03-24 LAB — COMPREHENSIVE METABOLIC PANEL
ALT: 13 U/L (ref 6–29)
AST: 20 U/L (ref 10–35)
Albumin: 4.3 g/dL (ref 3.6–5.1)
Alkaline Phosphatase: 59 U/L (ref 33–115)
BUN: 13 mg/dL (ref 7–25)
CALCIUM: 9.7 mg/dL (ref 8.6–10.2)
CHLORIDE: 104 mmol/L (ref 98–110)
CO2: 26 mmol/L (ref 20–31)
Creat: 0.68 mg/dL (ref 0.50–1.10)
GLUCOSE: 104 mg/dL — AB (ref 65–99)
POTASSIUM: 4.9 mmol/L (ref 3.5–5.3)
Sodium: 141 mmol/L (ref 135–146)
Total Bilirubin: 0.9 mg/dL (ref 0.2–1.2)
Total Protein: 7.1 g/dL (ref 6.1–8.1)

## 2017-03-24 LAB — VITAMIN D 25 HYDROXY (VIT D DEFICIENCY, FRACTURES): VIT D 25 HYDROXY: 49 ng/mL (ref 30–100)

## 2017-03-26 ENCOUNTER — Telehealth: Payer: Self-pay | Admitting: Radiology

## 2017-03-26 NOTE — Telephone Encounter (Signed)
-----   Message from [place], [place] sent at 03/24/2017  5:42 PM EDT -----  Please tell patient #1: Vitamin D is within normal #2: CBC with differential is within normal limits #3: CMP with GFR is within normal limits

## 2017-03-26 NOTE — Telephone Encounter (Signed)
I have called patient to advise labs are normal  

## 2017-05-24 ENCOUNTER — Other Ambulatory Visit: Payer: Self-pay | Admitting: *Deleted

## 2017-05-24 DIAGNOSIS — M0609 Rheumatoid arthritis without rheumatoid factor, multiple sites: Secondary | ICD-10-CM

## 2017-05-24 MED ORDER — ADALIMUMAB 40 MG/0.8ML ~~LOC~~ AJKT
1.0000 | AUTO-INJECTOR | SUBCUTANEOUS | 0 refills | Status: DC
Start: 2017-05-24 — End: 2017-09-19

## 2017-05-24 NOTE — Telephone Encounter (Signed)
Last Visit: 03/23/17 Next Visit: 08/23/17 Labs: 03/23/17 WNL TB gold negative 07/24/2016  Okay to refill per Dr. Corliss Skainseveshwar

## 2017-08-10 NOTE — Progress Notes (Signed)
Office Visit Note  Patient: Sheena Rivera             Date of Birth: 12/18/1970           MRN: [id number]             PCP: Patient, No Pcp Per Referring: No ref. provider found Visit Date: 08/23/2017 Occupation: @GUAROCC @    Subjective:  Joint stiffness   History of Present Illness: [name] is a 46 y.o. female with history of seronegative rheumatoid arthritis. She states she's doing quite well on combination of methotrexate and Humira. She still can feel some arthralgias prior to her next Humira dose. She denies any joint swelling or flares since her last visit.she reports intermittent discomfort in her hands and feet.  Activities of Daily Living:  Patient reports morning stiffness for 20 minutes.   Patient Denies nocturnal pain.  Difficulty dressing/grooming: Denies Difficulty climbing stairs: Denies Difficulty getting out of chair: Reports Difficulty using hands for taps, buttons, cutlery, and/or writing: Denies   Review of Systems  Constitutional: Positive for fatigue. Negative for night sweats, weight gain, weight loss and weakness.  HENT: Negative for mouth sores, trouble swallowing, trouble swallowing, mouth dryness and nose dryness.   Eyes: Positive for dryness. Negative for pain, redness and visual disturbance.  Respiratory: Negative for cough, shortness of breath and difficulty breathing.   Cardiovascular: Negative for chest pain, palpitations, hypertension, irregular heartbeat and swelling in legs/feet.  Gastrointestinal: Negative for blood in stool, constipation and diarrhea.  Endocrine: Negative for increased urination.  Genitourinary: Negative for vaginal dryness.  Musculoskeletal: Positive for morning stiffness. Negative for arthralgias, joint pain, joint swelling, myalgias, muscle weakness, muscle tenderness and myalgias.  Skin: Negative for color change, rash, hair loss, skin tightness, ulcers and sensitivity to sunlight.  Allergic/Immunologic: Negative for  susceptible to infections.  Neurological: Negative for dizziness, memory loss and night sweats.  Hematological: Negative for swollen glands.  Psychiatric/Behavioral: Negative for depressed mood and sleep disturbance. The patient is not nervous/anxious.     PMFS History:  Patient Active Problem List   Diagnosis Date Noted  . 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 High risk medication use 10/01/2016  . Vitamin D deficiency 10/01/2016  . Plantar fasciitis 10/01/2016    Past Medical History:  Diagnosis Date  . Arthritis     Family History  Problem Relation Age of Onset  . Ovarian cancer Mother   . COPD Father   . Depression Son    History reviewed. No pertinent surgical history. Social History   Social History Narrative  . Not on file     Objective: Vital Signs: BP 137/87 (BP Location: Left Arm, Patient Position: Sitting, Cuff Size: Normal)   Pulse 87   Ht 5\' 1"  (1.549 m)   Wt 131 lb (59.4 kg)   BMI 24.75 kg/m    Physical Exam  Constitutional: She is oriented to person, place, and time. She appears well-developed and well-nourished.  HENT:  Head: Normocephalic and atraumatic.  Eyes: Conjunctivae and EOM are normal.  Neck: Normal range of motion.  Cardiovascular: Normal rate, regular rhythm, normal heart sounds and intact distal pulses.  Pulmonary/Chest: Effort normal and breath sounds normal.  Abdominal: Soft. Bowel sounds are normal.  Lymphadenopathy:    She has no cervical adenopathy.  Neurological: She is alert and oriented to person, place, and time.  Skin: Skin is warm and dry. Capillary refill takes less than 2 seconds.  Psychiatric: She  has a normal mood and affect. Her behavior is normal.  Nursing note and vitals reviewed.    Musculoskeletal Exam: C-spine and thoracic lumbar spine good range of motion. Shoulder joints elbow joints wrist joint MCPs PIPs DIPs with good range of motion with no synovitis. Hip  joints knee joints ankles MTPs PIPs DIPs with good range of motion with no synovitis.  CDAI Exam: CDAI Homunculus Exam:   Joint Counts:  CDAI Tender Joint count: 0 CDAI Swollen Joint count: 0  Global Assessments:  Patient Global Assessment: 2 Provider Global Assessment: 2  CDAI Calculated Score: 4    Investigation: No additional findings.TB Gold: negative in 07/2016 CBC Latest Ref Rng & Units 03/23/2017 10/23/2016  WBC 3.8 - 10.8 K/uL 9.5 7.4  Hemoglobin 11.7 - 15.5 g/dL 16.1 09.6  Hematocrit 04.5 - 45.0 % 44.8 42.9  Platelets 140 - 400 K/uL 323 280   CMP Latest Ref Rng & Units 03/23/2017 10/23/2016  Glucose 65 - 99 mg/dL 409(W) 119(J)  BUN 7 - 25 mg/dL 13 14  Creatinine 4.78 - 1.10 mg/dL 2.95 6.21  Sodium 308 - 146 mmol/L 141 138  Potassium 3.5 - 5.3 mmol/L 4.9 4.7  Chloride 98 - 110 mmol/L 104 103  CO2 20 - 31 mmol/L 26 25  Calcium 8.6 - 10.2 mg/dL 9.7 9.3  Total Protein 6.1 - 8.1 g/dL 7.1 6.7  Total Bilirubin 0.2 - 1.2 mg/dL 0.9 1.1  Alkaline Phos 33 - 115 U/L 59 53  AST 10 - 35 U/L 20 20  ALT 6 - 29 U/L 13 13    Imaging: Xr Foot 2 Views Left  Result Date: 08/23/2017 No MTP PIP/DIP narrowing or erosive changes were noted. No intertarsal joint space narrowing was noted. Impression: normal x-ray of the foot.  Xr Foot 2 Views Right  Result Date: 08/23/2017 No MTP PIP/DIP narrowing or erosive changes were noted. No intertarsal joint space narrowing was noted. Impression: normal x-ray of the foot.  Xr Hand 2 View Left  Result Date: 08/23/2017 Juxta articular osteopenia was noted.Minimal PIP/DIP narrowing was noted. No erosive changes were noted. No intercarpal joint space narrowing was noted. 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   Xr Hand 2 View Right  Result Date: 08/23/2017 Juxta-articular osteopenia noted. No MCP or intercarpal joint space narrowing was noted. No PIP/DIP changes noted. Cystic versus  erosive changes were noted in the carpal bones. Impression: These findings are consistent with rheumatoid arthritis.   Speciality Comments: No specialty comments available.    Procedures:  No procedures performed Allergies: Patient has no known allergies.   Assessment / Plan:     Visit Diagnoses: Rheumatoid arthritis without elevated rheumatoid factor (HCC) - -RF, -CCP, -ANA, seronegative.patient has no synovitis on examination. Although she continues to have some intermittent arthralgias and morning stiffness. She believes she is quite pleased with the current regimen.  Pain in both hands - Plan: XR Hand 2 View Right, XR Hand 2 View Left  Pain in both feet - Plan: XR Foot 2 Views Right, XR Foot 2 Views Left  High risk medication use - Humira40 mg subcutaneous every other week, MTX 7 tablets by mouth every week, folic acid 2 tablets by mouth daily - Plan: CBC with Differential/Platelet, COMPLETE METABOLIC PANEL WITH GFR, Quantiferon tb gold assay (blood), CBC with Differential/Platelet, COMPLETE METABOLIC PANEL WITH GFR. I will  check labs today and then every 3 months to monitor for drug toxicity.  History of vitamin  D deficiency: Her vitamin D was within normal range in June 2018. Use of calcium and vitamin D was discussed.  Arnold-Chiari malformation (HCC)  History of tubal ligation   Association of heart disease with rheumatoid arthritis was discussed. Need to monitor blood pressure, cholesterol, and to exercise 30-60 minutes on daily basis was discussed. Poor dental hygiene can be a predisposing factor for rheumatoid arthritis. Good dental hygiene was discussed.  Orders: Orders Placed This Encounter  Procedures  . XR Hand 2 View Right  . XR Hand 2 View Left  . XR Foot 2 Views Right  . XR Foot 2 Views Left  . CBC with Differential/Platelet  . COMPLETE METABOLIC PANEL WITH GFR  . Quantiferon tb gold assay (blood)  . CBC with Differential/Platelet  . COMPLETE METABOLIC PANEL  WITH GFR   Meds ordered this encounter  Medications  . methotrexate (RHEUMATREX) 2.5 MG tablet    Sig: Take 7 tablets (17.5 mg total) once a week by mouth. Caution:Chemotherapy. Protect from light.    Dispense:  84 tablet    Refill:  0      Follow-Up Instructions: Return in about 5 months (around 01/21/2018) for Rheumatoid arthritis.   Pollyann SavoyShaili Shresta Risden, MD  Note - This record has been created using Animal nutritionistDragon software.  Chart creation errors have been sought, but may not always  have been located. Such creation errors do not reflect on  the standard of medical care.

## 2017-08-23 ENCOUNTER — Ambulatory Visit (INDEPENDENT_AMBULATORY_CARE_PROVIDER_SITE_OTHER): Payer: PRIVATE HEALTH INSURANCE

## 2017-08-23 ENCOUNTER — Ambulatory Visit: Payer: PRIVATE HEALTH INSURANCE | Admitting: Rheumatology

## 2017-08-23 ENCOUNTER — Encounter: Payer: Self-pay | Admitting: Rheumatology

## 2017-08-23 ENCOUNTER — Encounter (INDEPENDENT_AMBULATORY_CARE_PROVIDER_SITE_OTHER): Payer: Self-pay

## 2017-08-23 ENCOUNTER — Ambulatory Visit (INDEPENDENT_AMBULATORY_CARE_PROVIDER_SITE_OTHER): Payer: Self-pay

## 2017-08-23 ENCOUNTER — Other Ambulatory Visit: Payer: Self-pay | Admitting: Rheumatology

## 2017-08-23 VITALS — BP 137/87 | HR 87 | Ht 61.0 in | Wt 131.0 lb

## 2017-08-23 DIAGNOSIS — M79671 Pain in right foot: Secondary | ICD-10-CM

## 2017-08-23 DIAGNOSIS — Z79899 Other long term (current) drug therapy: Secondary | ICD-10-CM

## 2017-08-23 DIAGNOSIS — M79672 Pain in left foot: Secondary | ICD-10-CM | POA: Diagnosis not present

## 2017-08-23 DIAGNOSIS — M79642 Pain in left hand: Secondary | ICD-10-CM

## 2017-08-23 DIAGNOSIS — M79641 Pain in right hand: Secondary | ICD-10-CM

## 2017-08-23 DIAGNOSIS — Q0700 Arnold-Chiari syndrome without spina bifida or hydrocephalus: Secondary | ICD-10-CM | POA: Diagnosis not present

## 2017-08-23 DIAGNOSIS — Z9851 Tubal ligation status: Secondary | ICD-10-CM | POA: Diagnosis not present

## 2017-08-23 DIAGNOSIS — M0600 Rheumatoid arthritis without rheumatoid factor, unspecified site: Secondary | ICD-10-CM

## 2017-08-23 DIAGNOSIS — Z8639 Personal history of other endocrine, nutritional and metabolic disease: Secondary | ICD-10-CM | POA: Diagnosis not present

## 2017-08-23 MED ORDER — METHOTREXATE 2.5 MG PO TABS: 17.5000 mg | ORAL_TABLET | ORAL | 0 refills | Status: DC

## 2017-08-23 NOTE — Patient Instructions (Addendum)
Standing Labs We placed an order today for your standing lab work.    Please come back and get your standing labs in February and every 3 months  We have open lab Monday through Friday from 8:30-11:30 AM and 1:30-4 PM at the office of Dr. Pollyann SavoyShaili Jesusa Stenerson.   The office is located at [address], [address], [place], [place] [address] No appointment is necessary.   Labs are drawn by [facility].  You may receive a bill from [facility] for your lab work. If you have any questions regarding directions or hours of operation,  please call [phone].     Association of heart disease with rheumatoid arthritis was discussed. Need to monitor blood pressure, cholesterol, and to exercise 30-60 minutes on daily basis was discussed. Poor dental hygiene can be a predisposing factor for rheumatoid arthritis. Good dental hygiene was discussed.

## 2017-08-25 LAB — COMPLETE METABOLIC PANEL WITH GFR
AG Ratio: 1.7 (calc) (ref 1.0–2.5)
ALT: 15 U/L (ref 6–29)
AST: 18 U/L (ref 10–35)
Albumin: 5 g/dL (ref 3.6–5.1)
Alkaline phosphatase (APISO): 91 U/L (ref 33–115)
BUN: 12 mg/dL (ref 7–25)
CALCIUM: 10 mg/dL (ref 8.6–10.2)
CO2: 26 mmol/L (ref 20–32)
CREATININE: 0.67 mg/dL (ref 0.50–1.10)
Chloride: 102 mmol/L (ref 98–110)
GFR, EST AFRICAN AMERICAN: 122 mL/min/{1.73_m2} (ref >=60)
GFR, EST NON AFRICAN AMERICAN: 105 mL/min/{1.73_m2} (ref >=60)
GLUCOSE: 104 mg/dL — AB (ref 65–99)
Globulin: 2.9 g/dL (calc) (ref 1.9–3.7)
Potassium: 4.1 mmol/L (ref 3.5–5.3)
Sodium: 138 mmol/L (ref 135–146)
TOTAL PROTEIN: 7.9 g/dL (ref 6.1–8.1)
Total Bilirubin: 0.9 mg/dL (ref 0.2–1.2)

## 2017-08-25 LAB — CBC WITH DIFFERENTIAL/PLATELET
BASOS ABS: 91 {cells}/uL (ref 0–200)
Basophils Relative: 1.2 %
EOS ABS: 502 {cells}/uL — AB (ref 15–500)
EOS PCT: 6.6 %
HEMATOCRIT: 44.4 % (ref 35.0–45.0)
HEMOGLOBIN: 15.3 g/dL (ref 11.7–15.5)
LYMPHS ABS: 2402 {cells}/uL (ref 850–3900)
MCH: 29.3 pg (ref 27.0–33.0)
MCHC: 34.5 g/dL (ref 32.0–36.0)
MCV: 84.9 fL (ref 80.0–100.0)
MPV: 11.2 fL (ref 7.5–12.5)
Monocytes Relative: 7.5 %
NEUTROS ABS: 4036 {cells}/uL (ref 1500–7800)
Neutrophils Relative %: 53.1 %
Platelets: 340 10*3/uL (ref 140–400)
RBC: 5.23 10*6/uL — ABNORMAL HIGH (ref 3.80–5.10)
RDW: 11.7 % (ref 11.0–15.0)
Total Lymphocyte: 31.6 %
WBC mixed population: 570 cells/uL (ref 200–950)
WBC: 7.6 10*3/uL (ref 3.8–10.8)

## 2017-08-25 LAB — QUANTIFERON TB GOLD ASSAY (BLOOD)
Mitogen-Nil: >10 IU/mL
QUANTIFERON(R)-TB GOLD: NEGATIVE
Quantiferon Nil Value: 0.11 IU/mL
Quantiferon Tb Ag Minus Nil Value: <0 IU/mL

## 2017-08-25 NOTE — Progress Notes (Signed)
WNL

## 2017-09-19 ENCOUNTER — Other Ambulatory Visit: Payer: Self-pay | Admitting: Rheumatology

## 2017-09-19 DIAGNOSIS — M0609 Rheumatoid arthritis without rheumatoid factor, multiple sites: Secondary | ICD-10-CM

## 2017-09-21 NOTE — Telephone Encounter (Signed)
Last Visit: 08/23/17 Next Visit: 01/19/18 Labs: 08/23/17 WNL TB Gold: 08/23/17 WNL  Okay to refill per Dr. Corliss Skainseveshwar

## 2017-09-28 IMAGING — CR DG CHEST 2V
2 series · 2 of 2 positions shown · non-contrast
Comparison: None.

CLINICAL DATA: Status post recent immunosuppressive therapy.

EXAM:
CHEST  2 VIEW

[chest pa]
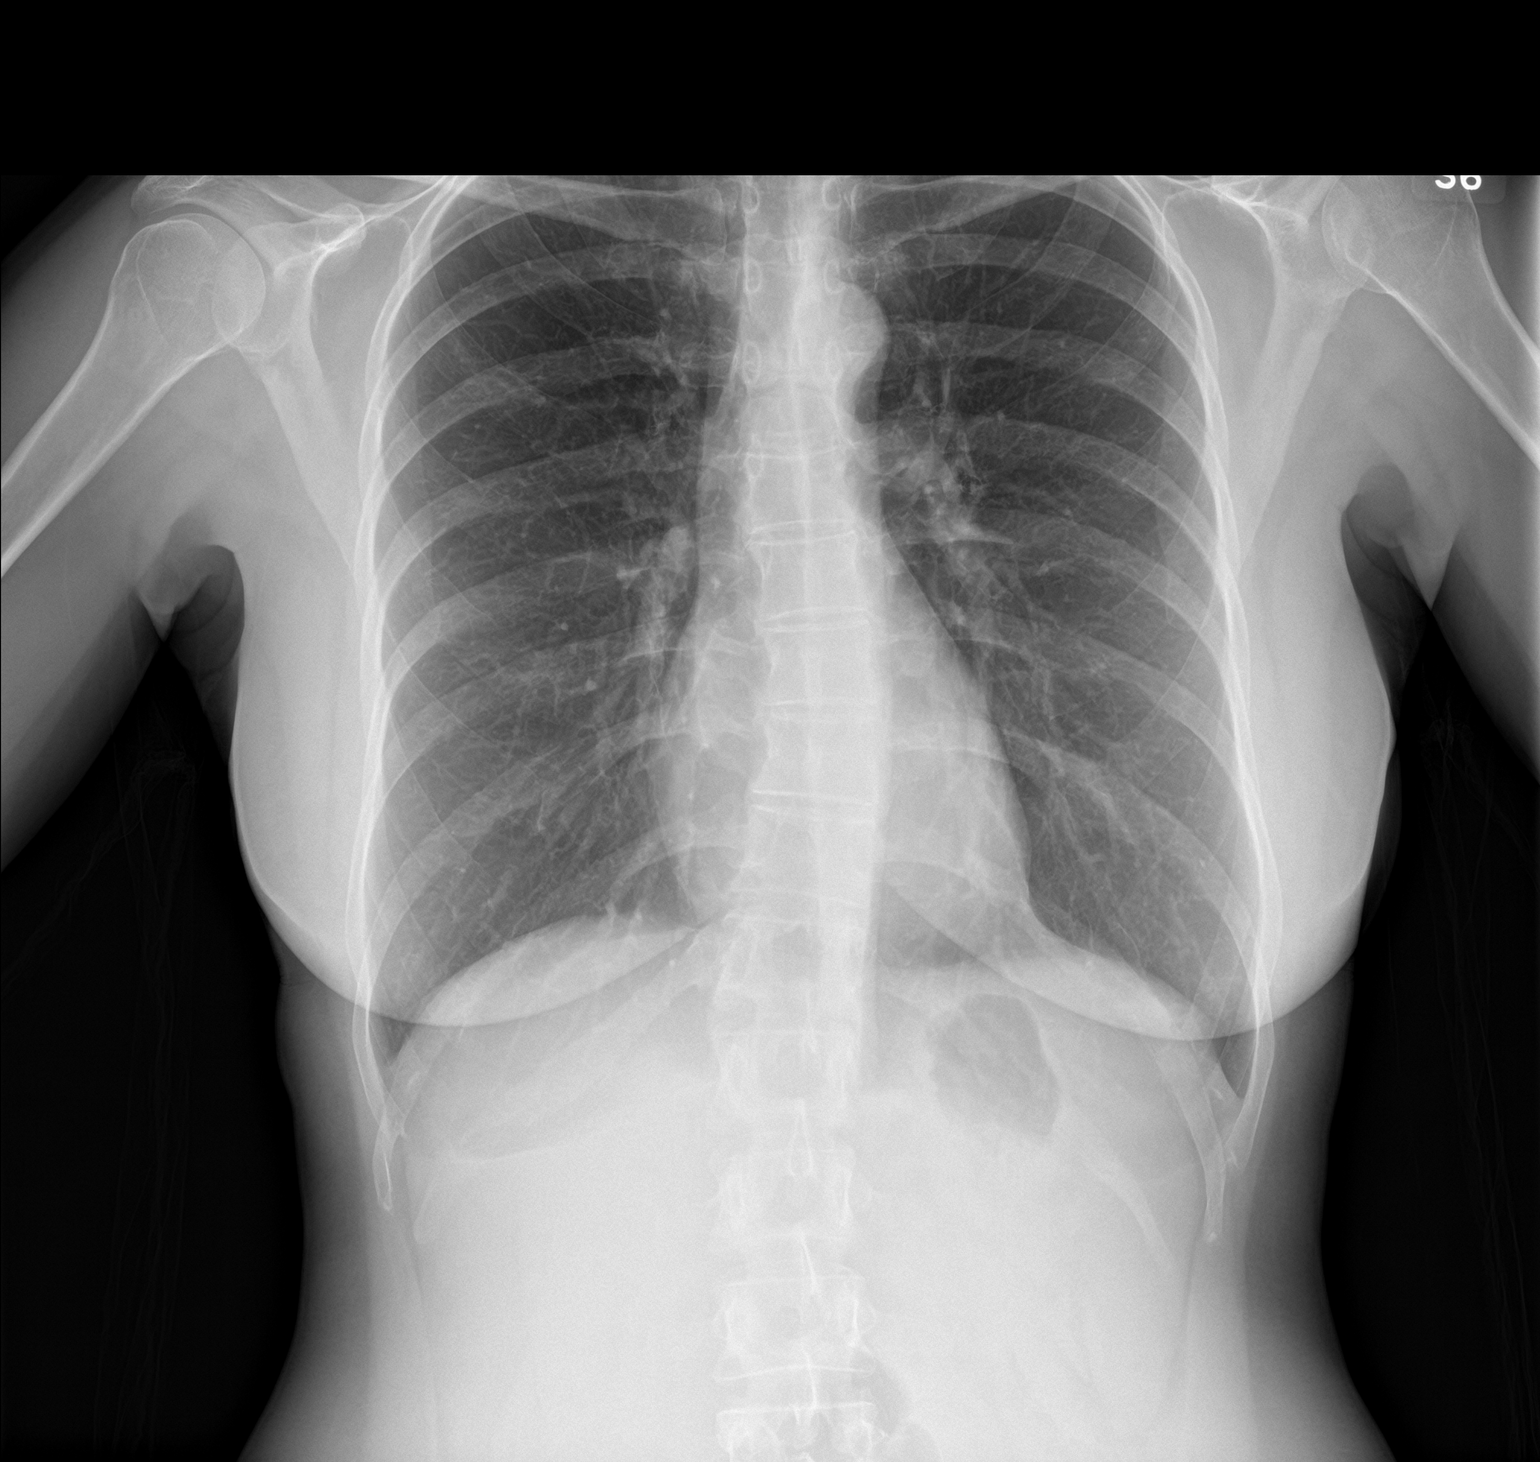

[chest lat]
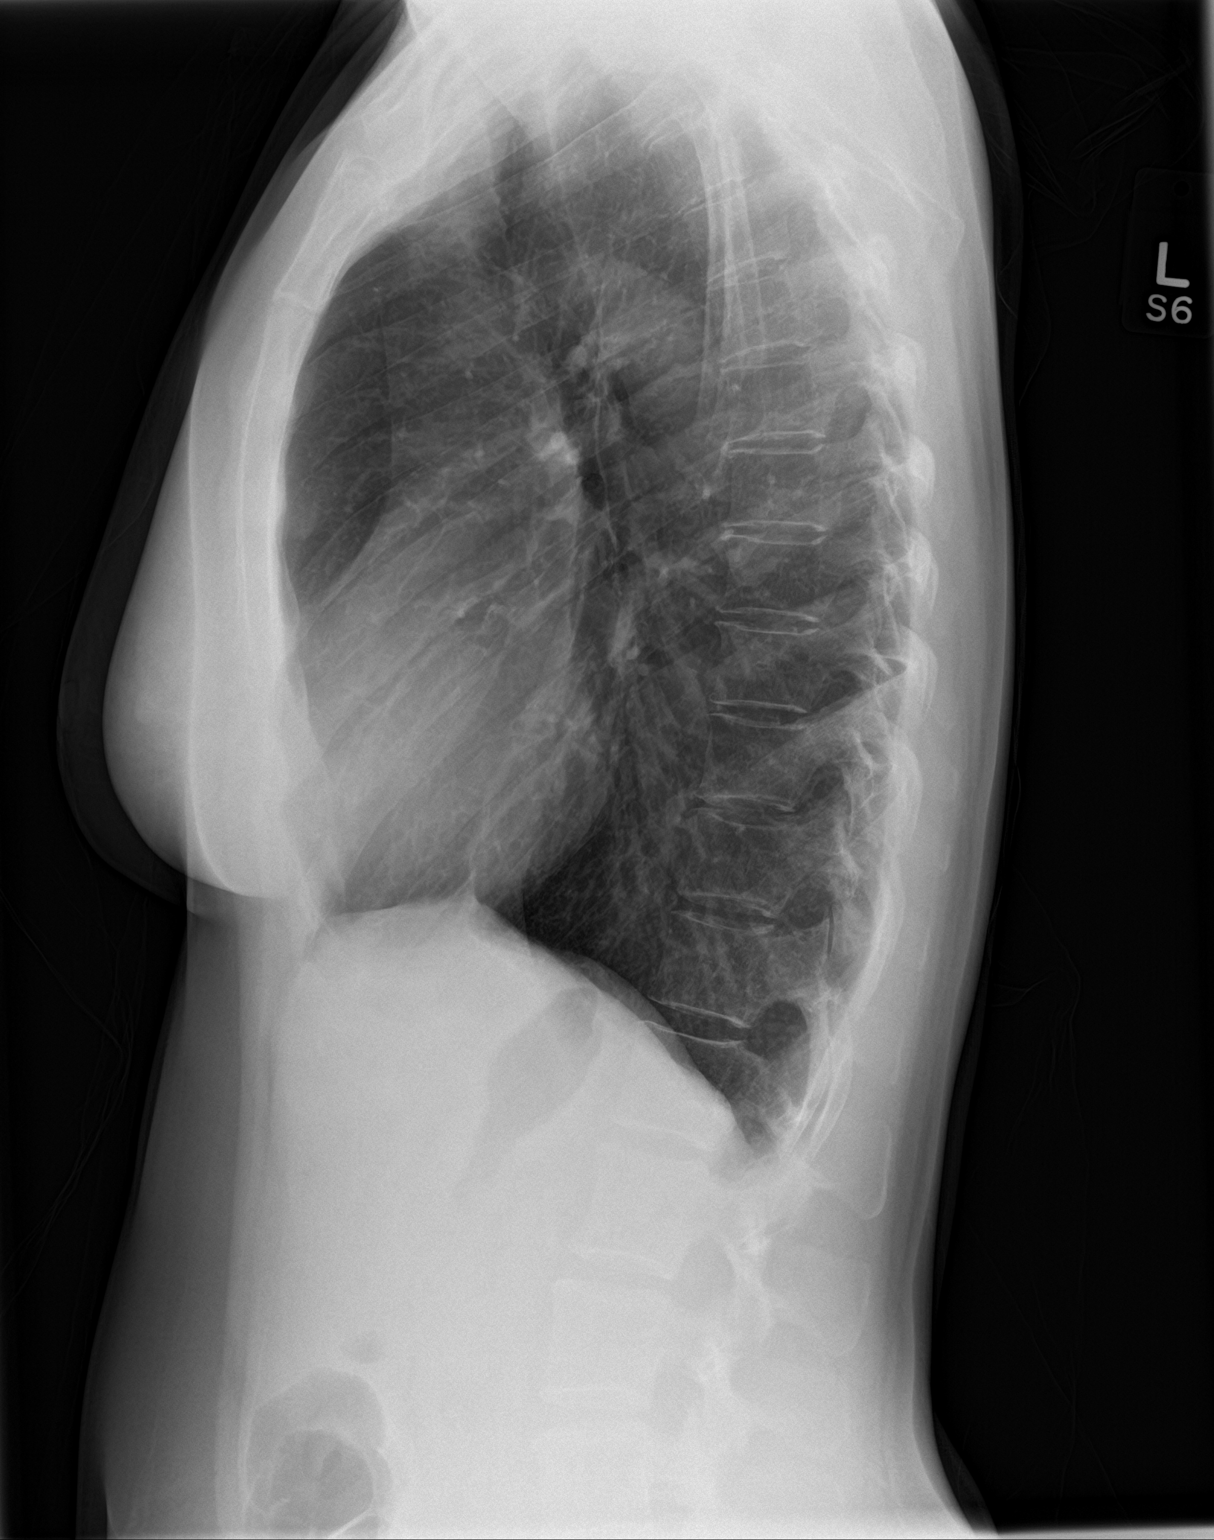

[2 of 2 positions shown; findings below may reference images not displayed]

FINDINGS: The cardiac silhouette, mediastinal and hilar contours are normal.
The lungs are clear. No pleural effusion. The bony thorax is intact.
IMPRESSION: No acute cardiopulmonary findings.

## 2017-10-05 ENCOUNTER — Telehealth: Payer: Self-pay

## 2017-10-05 NOTE — Telephone Encounter (Signed)
Received a fax from [facility] regarding patients refill for Humira. They have had unsuccessful attempts in contacting her for a refill. She was due on 09/30/27. Called patient to update. Left message.   Khalee Mazo, [place], CPhT 1:59 PM

## 2017-10-07 NOTE — Progress Notes (Deleted)
Rheumatoid arthritis without elevated rheumatoid factor (HCC) - -RF, -CCP, -ANA, seronegative.patient has no synovitis on examination. Although she continues to have some intermittent arthralgias and morning stiffness. She believes she is quite pleased with the current regimen.

## 2017-10-13 ENCOUNTER — Other Ambulatory Visit: Payer: Self-pay | Admitting: Rheumatology

## 2017-10-13 ENCOUNTER — Inpatient Hospital Stay (INDEPENDENT_AMBULATORY_CARE_PROVIDER_SITE_OTHER): Payer: PRIVATE HEALTH INSURANCE

## 2017-10-13 ENCOUNTER — Ambulatory Visit: Payer: PRIVATE HEALTH INSURANCE | Admitting: Rheumatology

## 2017-10-13 DIAGNOSIS — M79642 Pain in left hand: Secondary | ICD-10-CM

## 2017-10-13 DIAGNOSIS — M79641 Pain in right hand: Secondary | ICD-10-CM

## 2017-12-08 ENCOUNTER — Telehealth: Payer: Self-pay | Admitting: Rheumatology

## 2017-12-08 DIAGNOSIS — M0609 Rheumatoid arthritis without rheumatoid factor, multiple sites: Secondary | ICD-10-CM

## 2017-12-08 MED ORDER — ADALIMUMAB 40 MG/0.8ML ~~LOC~~ AJKT: 40.0000 mg | AUTO-INJECTOR | SUBCUTANEOUS | 0 refills | Status: DC

## 2017-12-08 MED ORDER — METHOTREXATE 2.5 MG PO TABS: 17.5000 mg | ORAL_TABLET | ORAL | 0 refills | Status: DC

## 2017-12-08 NOTE — Telephone Encounter (Signed)
Last Visit: 08/23/17 Next Visit: 01/19/18 Labs: 08/23/17 WNL TB Gold: 08/23/17 WNL  Okay to refill 30 day supply per Dr. Corliss Skainseveshwar

## 2017-12-08 NOTE — Telephone Encounter (Signed)
Patient uses [facility], and needs new rx for Humira. Also, request ref[facility] on MTX [facility] to  [facility] [place] on [address]

## 2017-12-17 ENCOUNTER — Other Ambulatory Visit: Payer: Self-pay

## 2017-12-17 DIAGNOSIS — Z79899 Other long term (current) drug therapy: Secondary | ICD-10-CM

## 2017-12-17 LAB — CBC WITH DIFFERENTIAL/PLATELET
BASOS ABS: 100 {cells}/uL (ref 0–200)
Basophils Relative: 1.2 %
Eosinophils Absolute: 639 cells/uL — ABNORMAL HIGH (ref 15–500)
Eosinophils Relative: 7.7 %
HEMATOCRIT: 46.4 % — AB (ref 35.0–45.0)
HEMOGLOBIN: 16.2 g/dL — AB (ref 11.7–15.5)
LYMPHS ABS: 2117 {cells}/uL (ref 850–3900)
MCH: 29.2 pg (ref 27.0–33.0)
MCHC: 34.9 g/dL (ref 32.0–36.0)
MCV: 83.8 fL (ref 80.0–100.0)
MPV: 11.2 fL (ref 7.5–12.5)
Monocytes Relative: 7.1 %
NEUTROS ABS: 4856 {cells}/uL (ref 1500–7800)
NEUTROS PCT: 58.5 %
Platelets: 320 10*3/uL (ref 140–400)
RBC: 5.54 10*6/uL — AB (ref 3.80–5.10)
RDW: 12.3 % (ref 11.0–15.0)
Total Lymphocyte: 25.5 %
WBC: 8.3 10*3/uL (ref 3.8–10.8)
WBCMIX: 589 {cells}/uL (ref 200–950)

## 2017-12-17 LAB — COMPLETE METABOLIC PANEL WITH GFR
AG Ratio: 1.8 (calc) (ref 1.0–2.5)
ALBUMIN MSPROF: 4.6 g/dL (ref 3.6–5.1)
ALT: 15 U/L (ref 6–29)
AST: 23 U/L (ref 10–35)
Alkaline phosphatase (APISO): 85 U/L (ref 33–115)
BUN: 13 mg/dL (ref 7–25)
CALCIUM: 10.3 mg/dL — AB (ref 8.6–10.2)
CO2: 29 mmol/L (ref 20–32)
CREATININE: 0.72 mg/dL (ref 0.50–1.10)
Chloride: 104 mmol/L (ref 98–110)
GFR, EST AFRICAN AMERICAN: 116 mL/min/{1.73_m2} (ref >=60)
GFR, Est Non African American: 100 mL/min/{1.73_m2} (ref >=60)
GLUCOSE: 99 mg/dL (ref 65–99)
Globulin: 2.5 g/dL (calc) (ref 1.9–3.7)
Potassium: 4.8 mmol/L (ref 3.5–5.3)
Sodium: 140 mmol/L (ref 135–146)
TOTAL PROTEIN: 7.1 g/dL (ref 6.1–8.1)
Total Bilirubin: 0.9 mg/dL (ref 0.2–1.2)

## 2017-12-20 NOTE — Progress Notes (Signed)
Labs are stable.

## 2018-01-18 NOTE — Progress Notes (Signed)
Office Visit Note  Patient: Sheena Rivera             Date of Birth: [birth year]           MRN: [id number]             PCP: Patient, No Pcp Per Referring: No ref. provider found Visit Date: 02/01/2018 Occupation: @GUAROCC @    Subjective: Medication management   History of Present Illness: Sheena Rivera is a 47 y.o. female with history of seronegative rheumatoid arthritis.  She has been doing really well on current combination of medication.  She denies any joint pain or joint swelling.  She has been taking methotrexate 7 tablets/week and Humira every other week.  Activities of Daily Living:  Patient reports morning stiffness for 0 minute.   Patient Denies nocturnal pain.  Difficulty dressing/grooming: Denies Difficulty climbing stairs: Denies Difficulty getting out of chair: Denies Difficulty using hands for taps, buttons, cutlery, and/or writing: Denies   Review of Systems  Constitutional: Positive for fatigue. Negative for night sweats, weight gain and weight loss.  HENT: Negative for ear pain, mouth sores, trouble swallowing, trouble swallowing, mouth dryness and nose dryness.   Eyes: Negative for pain, redness, visual disturbance and dryness.  Respiratory: Negative for cough, shortness of breath and difficulty breathing.   Cardiovascular: Negative for chest pain, palpitations, hypertension, irregular heartbeat and swelling in legs/feet.  Gastrointestinal: Negative for blood in stool, constipation and diarrhea.  Endocrine: Negative for increased urination.  Genitourinary: Negative for difficulty urinating and vaginal dryness.  Musculoskeletal: Negative for arthralgias, joint pain, joint swelling, myalgias, muscle weakness, morning stiffness, muscle tenderness and myalgias.  Skin: Negative for color change, rash, hair loss, skin tightness, ulcers and sensitivity to sunlight.  Allergic/Immunologic: Negative for susceptible to infections.  Neurological: Negative for dizziness,  numbness, memory loss, night sweats and weakness.  Hematological: Negative for bruising/bleeding tendency and swollen glands.  Psychiatric/Behavioral: Positive for depressed mood and sleep disturbance. The patient is not nervous/anxious.     PMFS History:  Patient Active Problem List   Diagnosis Date Noted  . 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 High risk medication use 10/01/2016  . Vitamin D deficiency 10/01/2016  . Plantar fasciitis 10/01/2016    Past Medical History:  Diagnosis Date  . Arthritis     Family History  Problem Relation Age of Onset  . Ovarian cancer Mother   . COPD Father   . Depression Son    History reviewed. No pertinent surgical history. Social History   Social History Narrative  . Not on file     Objective: Vital Signs: BP 126/86 (BP Location: Left Arm, Patient Position: Sitting, Cuff Size: Normal)   Pulse 81   Resp 16   Ht 5\' 1"  (1.549 m)   Wt 135 lb (61.2 kg)   BMI 25.51 kg/m    Physical Exam  Constitutional: She is oriented to person, place, and time. She appears well-developed and well-nourished.  HENT:  Head: Normocephalic and atraumatic.  Eyes: Conjunctivae and EOM are normal.  Neck: Normal range of motion.  Cardiovascular: Normal rate, regular rhythm, normal heart sounds and intact distal pulses.  Pulmonary/Chest: Effort normal and breath sounds normal.  Abdominal: Soft. Bowel sounds are normal.  Lymphadenopathy:    She has no cervical adenopathy.  Neurological: She is alert and oriented to person, place, and time.  Skin: Skin is warm and dry. Capillary refill takes less than 2 seconds.  Psychiatric: She has  a normal mood and affect. Her behavior is normal.  Nursing note and vitals reviewed.    Musculoskeletal Exam: C-spine thoracic lumbar spine good range of motion.  Shoulder joints elbow joints wrist joint MCPs PIPs DIPs were in good range of motion with no synovitis.  Hip joints  knee joints ankles MTPs PIPs DIPs with good range of motion with no synovitis. CDAI Exam: CDAI Homunculus Exam:   Joint Counts:  CDAI Tender Joint count: 0 CDAI Swollen Joint count: 0  Global Assessments:  Patient Global Assessment: 1 Provider Global Assessment: 1  CDAI Calculated Score: 2    Investigation: No additional findings.TB Gold: 08/23/2017 Negative  CBC Latest Ref Rng & Units 12/17/2017 08/23/2017 03/23/2017  WBC 3.8 - 10.8 Thousand/uL 8.3 7.6 9.5  Hemoglobin 11.7 - 15.5 g/dL 16.2(H) 15.3 15.0  Hematocrit 35.0 - 45.0 % 46.4(H) 44.4 44.8  Platelets 140 - 400 Thousand/uL 320 340 323   CMP Latest Ref Rng & Units 12/17/2017 08/23/2017 03/23/2017  Glucose 65 - 99 mg/dL 99 914(N) 829(F)  BUN 7 - 25 mg/dL 13 12 13   Creatinine 0.50 - 1.10 mg/dL 6.21 3.08 6.57  Sodium 135 - 146 mmol/L 140 138 141  Potassium 3.5 - 5.3 mmol/L 4.8 4.1 4.9  Chloride 98 - 110 mmol/L 104 102 104  CO2 20 - 32 mmol/L 29 26 26   Calcium 8.6 - 10.2 mg/dL 10.3(H) 10.0 9.7  Total Protein 6.1 - 8.1 g/dL 7.1 7.9 7.1  Total Bilirubin 0.2 - 1.2 mg/dL 0.9 0.9 0.9  Alkaline Phos 33 - 115 U/L - - 59  AST 10 - 35 U/L 23 18 20   ALT 6 - 29 U/L 15 15 13     Imaging: No results found.  Speciality Comments: No specialty comments available.    Procedures:  No procedures performed Allergies: Patient has no known allergies.   Assessment / Plan:     Visit Diagnoses: Rheumatoid arthritis of multiple sites without rheumatoid factor (HCC) - -RF, -CCP, -ANA, seronegative.  Patient is clinically doing well with no synovitis on examination.  High risk medication use - Humira40 mg subcutaneous every other week, MTX 7 tablets by mouth every week, folic acid 2 tablets by mouth daily.  Her labs are stable.  We will continue to monitor her labs every 3 months.  I advised her not to take calcium supplement as her calcium was elevated.  She is also interested in starting on new formulation for Humira as her current tumor has been  causing a burning sensation.  She has only one sample left.  We will demonstrated new Humira formulation and change the medication.  A prescription refill for methotrexate was also given.  Arnold-Chiari malformation (HCC)  History of vitamin D deficiency: She has been taking vitamin D supplement.  Plantar fasciitis: Her symptoms have improved Trumper fitting shoes.   Orders: No orders of the defined types were placed in this encounter.  Meds ordered this encounter  Medications  . methotrexate (RHEUMATREX) 2.5 MG tablet    Sig: Take 7 tablets (17.5 mg total) by mouth once a week. Caution:Chemotherapy. Protect from light.    Dispense:  84 tablet    Refill:  0  . Adalimumab (HUMIRA PEN) 40 MG/0.4ML PNKT    Sig: Inject 40 mg into the skin every 14 (fourteen) days.    Dispense:  6 each    Refill:  0    Face-to-face time spent with patient was 30 minutes.  Greater than 50% of time was spent  in counseling and coordination of care.  Follow-Up Instructions: Return in about 5 months (around 07/04/2018) for Rheumatoid arthritis.   Pollyann SavoyShaili Emalynn Clewis, MD  Note - This record has been created using Animal nutritionistDragon software.  Chart creation errors have been sought, but may not always  have been located. Such creation errors do not reflect on  the standard of medical care.

## 2018-01-19 ENCOUNTER — Other Ambulatory Visit: Payer: PRIVATE HEALTH INSURANCE | Admitting: Rheumatology

## 2018-02-01 ENCOUNTER — Ambulatory Visit: Payer: PRIVATE HEALTH INSURANCE | Admitting: Rheumatology

## 2018-02-01 ENCOUNTER — Encounter: Payer: Self-pay | Admitting: Rheumatology

## 2018-02-01 VITALS — BP 126/86 | HR 81 | Resp 16 | Ht 61.0 in | Wt 135.0 lb

## 2018-02-01 DIAGNOSIS — Z79899 Other long term (current) drug therapy: Secondary | ICD-10-CM

## 2018-02-01 DIAGNOSIS — Z8639 Personal history of other endocrine, nutritional and metabolic disease: Secondary | ICD-10-CM

## 2018-02-01 DIAGNOSIS — M0609 Rheumatoid arthritis without rheumatoid factor, multiple sites: Secondary | ICD-10-CM | POA: Diagnosis not present

## 2018-02-01 DIAGNOSIS — Q0700 Arnold-Chiari syndrome without spina bifida or hydrocephalus: Secondary | ICD-10-CM

## 2018-02-01 DIAGNOSIS — M722 Plantar fascial fibromatosis: Secondary | ICD-10-CM

## 2018-02-01 MED ORDER — ADALIMUMAB 40 MG/0.4ML ~~LOC~~ AJKT: 40.0000 mg | AUTO-INJECTOR | SUBCUTANEOUS | 0 refills | Status: DC

## 2018-02-01 MED ORDER — METHOTREXATE 2.5 MG PO TABS: 17.5000 mg | ORAL_TABLET | ORAL | 0 refills | Status: DC

## 2018-02-01 NOTE — Patient Instructions (Signed)
Standing Labs We placed an order today for your standing lab work.    Please come back and get your standing labs in June and every 3 months  We have open lab Monday through Friday from 8:30-11:30 AM and 1:30-4:00 PM  at the office of Dr. Lela Murfin.   You may experience shorter wait times on Monday and Friday afternoons. The office is located at [address], [address], [place], [place] [address] No appointment is necessary.   Labs are drawn by [facility].  You may receive a bill from [facility] for your lab work. If you have any questions regarding directions or hours of operation,  please call [phone].    

## 2018-06-21 NOTE — Progress Notes (Signed)
Office Visit Note  Patient: Sheena Rivera             Date of Birth: 03/15/1971           MRN: [id number]             PCP: Patient, No Pcp Per Referring: No ref. provider found Visit Date: 07/05/2018 Occupation: @GUAROCC @  Subjective:  Right elbow joint pain   History of Present Illness: Sheena Rivera is a 47 y.o. female with history of seronegative rheumatoid arthritis.  She reports that she has not been compliant with her medications.  She states that she has been off of methotrexate for about 1 month.  She states she is spacing Humira out to every 3 weeks.  She reports that she should be taking methotrexate 7 tablets by mouth once weekly and folic acid 2 mg daily.  She plans on restarting her medications.  She states that she is having increased pain and swelling in bilateral elbow joints.  She states that she started having limited range of motion of the right elbow joint.  She states she is having increased stiffness in both elbows.  She denies any other joint pain or joint swelling at this time.   Activities of Daily Living:  Patient reports morning stiffness for 10  minutes.   Patient Denies nocturnal pain.  Difficulty dressing/grooming: Denies Difficulty climbing stairs: Denies Difficulty getting out of chair: Denies Difficulty using hands for taps, buttons, cutlery, and/or writing: Denies  Review of Systems  Constitutional: Positive for fatigue.  HENT: Positive for mouth dryness. Negative for mouth sores and nose dryness.   Eyes: Positive for dryness. Negative for pain and visual disturbance.  Respiratory: Negative for cough, hemoptysis, shortness of breath and difficulty breathing.   Cardiovascular: Negative for chest pain, palpitations, hypertension and swelling in legs/feet.  Gastrointestinal: Negative for blood in stool, constipation and diarrhea.  Endocrine: Negative for increased urination.  Genitourinary: Negative for painful urination.  Musculoskeletal: Positive for  arthralgias, joint pain, joint swelling and morning stiffness. Negative for myalgias, muscle weakness, muscle tenderness and myalgias.  Skin: Negative for color change, pallor, rash, hair loss, nodules/bumps, skin tightness, ulcers and sensitivity to sunlight.  Allergic/Immunologic: Negative for susceptible to infections.  Neurological: Negative for dizziness, numbness, headaches and weakness.  Hematological: Negative for swollen glands.  Psychiatric/Behavioral: Positive for depressed mood and sleep disturbance. The patient is nervous/anxious.     PMFS History:  Patient Active Problem List   Diagnosis Date Noted  . 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 High risk medication use 10/01/2016  . Vitamin D deficiency 10/01/2016  . Plantar fasciitis 10/01/2016    Past Medical History:  Diagnosis Date  . Arthritis     Family History  Problem Relation Age of Onset  . Ovarian cancer Mother   . COPD Father   . Depression Son    History reviewed. No pertinent surgical history. Social History   Social History Narrative  . Not on file    Objective: Vital Signs: BP 119/83 (BP Location: Left Arm, Patient Position: Sitting, Cuff Size: Normal)   Pulse 83   Resp 14   Ht 5\' 1"  (1.549 m)   Wt 142 lb 6.4 oz (64.6 kg)   BMI 26.91 kg/m    Physical Exam  Constitutional: She is oriented to person, place, and time. She appears well-developed and well-nourished.  HENT:  Head: Normocephalic and atraumatic.  Eyes: Conjunctivae and EOM are normal.  Neck:  Normal range of motion.  Cardiovascular: Normal rate, regular rhythm, normal heart sounds and intact distal pulses.  Pulmonary/Chest: Effort normal and breath sounds normal.  Abdominal: Soft. Bowel sounds are normal.  Lymphadenopathy:    She has no cervical adenopathy.  Neurological: She is alert and oriented to person, place, and time.  Skin: Skin is warm and dry. Capillary refill takes less than  2 seconds.  Psychiatric: She has a normal mood and affect. Her behavior is normal.  Nursing note and vitals reviewed.    Musculoskeletal Exam: C-spine limited range of motion with lateral rotation.  Good flexion and extension of C-spine.  Thoracic and lumbar spine good range of motion.  No midline spinal tenderness.  No SI joint tenderness.  Shoulder joints good range of motion with no discomfort.  Mild right elbow joint contracture. Tenderness along right elbow joint line. Left elbow full range of motion with no discomfort.  Wrist joints, MCPs, PIPs, DIPs good range of motion with no synovitis.  She is complete fist formation bilaterally.  No tenderness of MCP joints.  Hip joints, knee joints, ankle joints, MTPs, PIPs, DIPs good range of motion no synovitis.  No warmth or effusion of bilateral knee joints.  No Achilles tendinitis or plantar fasciitis.  No tenderness of trochanteric bursa bilaterally.  CDAI Exam: CDAI Score: 4.2  Patient Global Assessment: 6 (mm); Provider Global Assessment: 6 (mm) Swollen: 1 ; Tender: 2  Joint Exam      Right  Left  Elbow  Swollen Tender   Tender     Investigation: No additional findings.  Imaging: No results found.  Recent Labs: Lab Results  Component Value Date   WBC 8.3 12/17/2017   HGB 16.2 (H) 12/17/2017   PLT 320 12/17/2017   NA 140 12/17/2017   K 4.8 12/17/2017   CL 104 12/17/2017   CO2 29 12/17/2017   GLUCOSE 99 12/17/2017   BUN 13 12/17/2017   CREATININE 0.72 12/17/2017   BILITOT 0.9 12/17/2017   ALKPHOS 59 03/23/2017   AST 23 12/17/2017   ALT 15 12/17/2017   PROT 7.1 12/17/2017   ALBUMIN 4.3 03/23/2017   CALCIUM 10.3 (H) 12/17/2017   GFRAA 116 12/17/2017   QFTBGOLD NEGATIVE 08/23/2017    Speciality Comments: No specialty comments available.  Procedures:  Medium Joint Inj: R elbow on 07/05/2018 8:46 AM Indications: pain and joint swelling Details: 27 G 1.5 in needle, posterior approach Medications: 1 mL lidocaine 1 %; 30  mg triamcinolone acetonide 40 MG/ML Aspirate: 0 mL Outcome: tolerated well, no immediate complications Procedure, treatment alternatives, risks and benefits explained, specific risks discussed. Consent was given by the patient. Immediately prior to procedure a time out was called to verify the correct patient, procedure, equipment, support staff and site/side marked as required. Patient was prepped and draped in the usual sterile fashion.     Allergies: Patient has no known allergies.   Assessment / Plan:     Visit Diagnoses: Rheumatoid arthritis of multiple sites without rheumatoid factor (HCC) -  -RF, -CCP, -ANA, seronegative: She has tenderness and inflammation of the right elbow joint. Mild right elbow joint contracture noted.  She has tenderness of the left elbow but no inflammation or contracture was noted.  She has not been compliant with her medications.  She has been spacing out Humira every 3 to 4 weeks, and she has been off of methotrexate for the past 1 month.  We discussed the importance of staying on her medications.  A  refill for Humira 40 mg subcutaneous injections every 14 days, methotrexate 7 tablets by mouth once weekly and folic acid 2 mg daily were sent to the pharmacy today.  She requested a right elbow joint cortisone injection today in the office.  She tolerated procedure well.  Potential side effects were discussed.  She was encouraged to get her yearly influenza vaccine.  Lab work will be obtained today.  She was advised to notify us if she develops increased joint pain or joint swelling.  She will follow-up in the office in 5 months.  High risk medication use - Humira40 mg subcutaneous every other week, MTX 7 tablets by mouth every week, folic acid 2 tablets by mouth daily.  CBC and CMP will be drawn today to monitor for drug toxicity.  She will return in December and every 3 months for lab work.  Future order for TB gold was placed today.   - Plan: CBC with  Differential/Platelet, COMPLETE METABOLIC PANEL WITH GFR, QuantiFERON-TB Gold Plus  Plantar fasciitis: Resolved  Right shoulder tendonitis: Good ROM with no discomfort at this time.    Other medical conditions are listed as follows:  Vitamin D deficiency  Arnold-Chiari malformation (HCC)   Orders: Orders Placed This Encounter  Procedures  . Medium Joint Inj: R elbow  . CBC with Differential/Platelet  . COMPLETE METABOLIC PANEL WITH GFR  . QuantiFERON-TB Gold Plus   Meds ordered this encounter  Medications  . methotrexate (RHEUMATREX) 2.5 MG tablet    Sig: Take 7 tablets (17.5 mg total) by mouth once a week. Caution:Chemotherapy. Protect from light.    Dispense:  84 tablet    Refill:  0  . folic acid (FOLVITE) 1 MG tablet    Sig: Take 2 tablets (2 mg total) by mouth daily.    Dispense:  180 tablet    Refill:  1  . Adalimumab (HUMIRA PEN) 40 MG/0.4ML PNKT    Sig: Inject 40 mg into the skin every 14 (fourteen) days.    Dispense:  3 each    Refill:  0    3 boxes=6 pens    Face-to-face time spent with patient was 30 minutes. Greater than 50% of time was spent in counseling and coordination of care.  Follow-Up Instructions: Return in about 5 months (around 12/05/2018) for Rheumatoid arthritis.   Gearldine Bienenstock, PA-C  Note - This record has been created using Dragon software.  Chart creation errors have been sought, but may not always  have been located. Such creation errors do not reflect on  the standard of medical care.

## 2018-07-05 ENCOUNTER — Encounter (INDEPENDENT_AMBULATORY_CARE_PROVIDER_SITE_OTHER): Payer: Self-pay

## 2018-07-05 ENCOUNTER — Ambulatory Visit: Payer: PRIVATE HEALTH INSURANCE | Admitting: Physician Assistant

## 2018-07-05 ENCOUNTER — Encounter: Payer: Self-pay | Admitting: Physician Assistant

## 2018-07-05 VITALS — BP 119/83 | HR 83 | Resp 14 | Ht 61.0 in | Wt 142.4 lb

## 2018-07-05 DIAGNOSIS — Q0700 Arnold-Chiari syndrome without spina bifida or hydrocephalus: Secondary | ICD-10-CM

## 2018-07-05 DIAGNOSIS — Z79899 Other long term (current) drug therapy: Secondary | ICD-10-CM

## 2018-07-05 DIAGNOSIS — M722 Plantar fascial fibromatosis: Secondary | ICD-10-CM | POA: Diagnosis not present

## 2018-07-05 DIAGNOSIS — M25521 Pain in right elbow: Secondary | ICD-10-CM | POA: Diagnosis not present

## 2018-07-05 DIAGNOSIS — M0609 Rheumatoid arthritis without rheumatoid factor, multiple sites: Secondary | ICD-10-CM

## 2018-07-05 DIAGNOSIS — M778 Other enthesopathies, not elsewhere classified: Secondary | ICD-10-CM

## 2018-07-05 DIAGNOSIS — M7581 Other shoulder lesions, right shoulder: Secondary | ICD-10-CM

## 2018-07-05 DIAGNOSIS — E559 Vitamin D deficiency, unspecified: Secondary | ICD-10-CM

## 2018-07-05 MED ORDER — ADALIMUMAB 40 MG/0.4ML ~~LOC~~ AJKT: 40.0000 mg | AUTO-INJECTOR | SUBCUTANEOUS | 0 refills | Status: DC

## 2018-07-05 MED ORDER — TRIAMCINOLONE ACETONIDE 40 MG/ML IJ SUSP
30.0000 mg | INTRAMUSCULAR | Status: AC | PRN
  Administered 2018-07-05: 30 mg via INTRA_ARTICULAR

## 2018-07-05 MED ORDER — METHOTREXATE 2.5 MG PO TABS: 17.5000 mg | ORAL_TABLET | ORAL | 0 refills | Status: DC

## 2018-07-05 MED ORDER — LIDOCAINE HCL 1 % IJ SOLN
1.0000 mL | INTRAMUSCULAR | Status: AC | PRN
  Administered 2018-07-05: 1 mL

## 2018-07-05 MED ORDER — FOLIC ACID 1 MG PO TABS: 2.0000 mg | ORAL_TABLET | Freq: Every day | ORAL | 1 refills | Status: DC

## 2018-07-05 NOTE — Patient Instructions (Signed)
Standing Labs We placed an order today for your standing lab work.    Please come back and get your standing labs in December and every 3 months   We have open lab Monday through Friday from 8:30-11:30 AM and 1:30-4:00 PM  at the office of Dr. Shaili Deveshwar.   You may experience shorter wait times on Monday and Friday afternoons. The office is located at [address], [address], [place], [place] [address] No appointment is necessary.   Labs are drawn by [facility].  You may receive a bill from [facility] for your lab work. If you have any questions regarding directions or hours of operation,  please call [phone].     

## 2018-07-06 LAB — CBC WITH DIFFERENTIAL/PLATELET
BASOS ABS: 78 {cells}/uL (ref 0–200)
Basophils Relative: 1 %
EOS ABS: 312 {cells}/uL (ref 15–500)
EOS PCT: 4 %
HCT: 44.1 % (ref 35.0–45.0)
HEMOGLOBIN: 14.7 g/dL (ref 11.7–15.5)
Lymphs Abs: 1880 cells/uL (ref 850–3900)
MCH: 28.2 pg (ref 27.0–33.0)
MCHC: 33.3 g/dL (ref 32.0–36.0)
MCV: 84.6 fL (ref 80.0–100.0)
MONOS PCT: 7.7 %
MPV: 10.2 fL (ref 7.5–12.5)
NEUTROS ABS: 4930 {cells}/uL (ref 1500–7800)
NEUTROS PCT: 63.2 %
Platelets: 330 10*3/uL (ref 140–400)
RBC: 5.21 10*6/uL — ABNORMAL HIGH (ref 3.80–5.10)
RDW: 11.5 % (ref 11.0–15.0)
Total Lymphocyte: 24.1 %
WBC mixed population: 601 cells/uL (ref 200–950)
WBC: 7.8 10*3/uL (ref 3.8–10.8)

## 2018-07-06 LAB — COMPLETE METABOLIC PANEL WITH GFR
AG Ratio: 1.6 (calc) (ref 1.0–2.5)
ALBUMIN MSPROF: 4.5 g/dL (ref 3.6–5.1)
ALT: 10 U/L (ref 6–29)
AST: 19 U/L (ref 10–35)
Alkaline phosphatase (APISO): 84 U/L (ref 33–115)
BILIRUBIN TOTAL: 0.9 mg/dL (ref 0.2–1.2)
BUN: 13 mg/dL (ref 7–25)
CHLORIDE: 103 mmol/L (ref 98–110)
CO2: 28 mmol/L (ref 20–32)
CREATININE: 0.66 mg/dL (ref 0.50–1.10)
Calcium: 9.8 mg/dL (ref 8.6–10.2)
GFR, Est African American: 122 mL/min/{1.73_m2} (ref >=60)
GFR, Est Non African American: 105 mL/min/{1.73_m2} (ref >=60)
Globulin: 2.8 g/dL (calc) (ref 1.9–3.7)
Glucose, Bld: 99 mg/dL (ref 65–99)
Potassium: 5 mmol/L (ref 3.5–5.3)
Sodium: 138 mmol/L (ref 135–146)
Total Protein: 7.3 g/dL (ref 6.1–8.1)

## 2018-07-06 NOTE — Progress Notes (Signed)
RBC count stable. All other labs are WNL.

## 2018-09-09 ENCOUNTER — Telehealth: Payer: Self-pay | Admitting: Pharmacy Technician

## 2018-09-09 NOTE — Telephone Encounter (Signed)
Received a Prior Authorization request from [facility] for Humira. Authorization has been submitted to patient's insurance via Cover My Meds. Will update once we receive a response.  11:21 AM Dorthula Nettlesachael N Alzada Brazee, CPhT

## 2018-09-12 NOTE — Telephone Encounter (Signed)
Received fax from [facility] stating that Humira is on the plan's list of covered drugs. No Prior Authorization is required at this time.  Will send document to scan center.  [id number] Phone- [phone]  8:55 AM Dorthula Nettlesachael N Christophor Eick, CPhT

## 2018-11-10 NOTE — Telephone Encounter (Signed)
Received a Prior Authorization request from Patient for HUMIRA due to plan changes. Authorization has been submitted to patient's insurance via Phone. Will update once we receive a response.  Called patient to advise, she will come by office for a sample.  [id number] Phone-[phone]  1:31 PM Dorthula Nettlesachael N Jamarion [place], CPhT

## 2018-11-10 NOTE — Telephone Encounter (Signed)
Received a fax from [facility] regarding a prior authorization for HUMIRA. Authorization has been APPROVED from 11/10/2018 to 11/11/2019.   Will send document to scan center.  Authorization # [id number] Phone # [phone]  Called patient to advise. She will stop by the office for a sample if there is any delay with her pharmacy.

## 2018-11-28 NOTE — Progress Notes (Signed)
Office Visit Note  Patient: Sheena Rivera             Date of Birth: 23-Nov-1970           MRN: [id number]             PCP: Patient, No Pcp Per Referring: No ref. provider found Visit Date: 12/12/2018 Occupation: @GUAROCC @  Subjective:  Right knee pain   History of Present Illness: Augustine Mayford Knifeurner is a 48 y.o. female with history of seronegative rheumatoid arthritis.  She is on Humira 40 mg sq injections every 14 days, MTX 7 tablets po once weekly, and folic acid 2 mg po daily.  She reports right knee joint pain and swelling for the past 10 days.  She states the pain and swelling have been improving.  She has been taking Advil for pain relief.  She reports she worked 8 shifts in a row which she feels exacerbated her symptoms.  She denies any injuries. She denies missing any doses of MTX or Humira.  She denies any other recent flares.  She denies any other joint pain or joint swelling.  She reports the plantar fasciitis has improved.  She denies any recent infections.  She continues to get yearly skin exams.     Activities of Daily Living:  Patient reports morning stiffness for 1 hour.   Patient Denies nocturnal pain.  Difficulty dressing/grooming: Denies Difficulty climbing stairs: Reports Difficulty getting out of chair: Reports Difficulty using hands for taps, buttons, cutlery, and/or writing: Reports  Review of Systems  Constitutional: Positive for fatigue.  HENT: Negative for mouth sores, mouth dryness and nose dryness.   Eyes: Negative for pain, itching, visual disturbance and dryness.  Respiratory: Negative for cough, hemoptysis, shortness of breath, wheezing and difficulty breathing.   Cardiovascular: Negative for chest pain, palpitations, hypertension and swelling in legs/feet.  Gastrointestinal: Negative for abdominal pain, blood in stool, constipation and diarrhea.  Endocrine: Negative for increased urination.  Genitourinary: Negative for painful urination, nocturia and  pelvic pain.  Musculoskeletal: Positive for arthralgias, joint pain, joint swelling and morning stiffness. Negative for myalgias, muscle weakness, muscle tenderness and myalgias.  Skin: Negative for color change, pallor, rash, hair loss, nodules/bumps, redness, skin tightness, ulcers and sensitivity to sunlight.  Allergic/Immunologic: Negative for susceptible to infections.  Neurological: Negative for dizziness, light-headedness, numbness, headaches, memory loss and weakness.  Hematological: Negative for swollen glands.  Psychiatric/Behavioral: Negative for depressed mood, confusion and sleep disturbance. The patient is not nervous/anxious.     PMFS History:  Patient Active Problem List   Diagnosis Date Noted  . 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 High risk medication use 10/01/2016  . Vitamin D deficiency 10/01/2016  . Plantar fasciitis 10/01/2016    Past Medical History:  Diagnosis Date  . Arthritis     Family History  Problem Relation Age of Onset  . Ovarian cancer Mother   . COPD Father   . Depression Son    History reviewed. No pertinent surgical history. Social History   Social History Narrative  . Not on file    There is no immunization history on file for this patient.   Objective: Vital Signs: BP 126/88 (BP Location: Left Arm, Patient Position: Sitting, Cuff Size: Normal)   Pulse 93   Resp 13   Ht 5\' 1"  (1.549 m)   Wt 142 lb (64.4 kg)   BMI 26.83 kg/m    Physical Exam Vitals signs and nursing note  reviewed.  Constitutional:      Appearance: She is well-developed.  HENT:     Head: Normocephalic and atraumatic.  Eyes:     Conjunctiva/sclera: Conjunctivae normal.  Neck:     Musculoskeletal: Normal range of motion.  Cardiovascular:     Rate and Rhythm: Normal rate and regular rhythm.     Heart sounds: Normal heart sounds.  Pulmonary:     Effort: Pulmonary effort is normal.     Breath sounds: Normal  breath sounds.  Abdominal:     General: Bowel sounds are normal.     Palpations: Abdomen is soft.  Lymphadenopathy:     Cervical: No cervical adenopathy.  Skin:    General: Skin is warm and dry.     Capillary Refill: Capillary refill takes less than 2 seconds.  Neurological:     Mental Status: She is alert and oriented to person, place, and time.  Psychiatric:        Behavior: Behavior normal.      Musculoskeletal Exam: C-spine limited ROM with lateral rotation.  Thoracic and lumbar spine good ROM.  No midline spinal tenderness.  No SI joint tenderness.  Shoulder joints, elbow joints, wrist joints, MCPs, PIPs, and DIPs good ROM with no synovitis.  Complete fist formation bilaterally.  Hip joints good ROM with no discomfort.  Right knee limited flexion and extension.  Warmth and swelling of the right knee joint.  Left knee good ROM with no warmth or effusion.  Ankle joints, MTPs, PIPs, and DIPs good ROM with no synovitis.  No tenderness or swelling of ankle joints.  No plantar fasciitis bilaterally.   CDAI Exam: CDAI Score: 3.2  Patient Global Assessment: 6 (mm); Provider Global Assessment: 6 (mm) Swollen: 1 ; Tender: 1  Joint Exam      Right  Left  Knee  Swollen Tender        Investigation: No additional findings.  Imaging: No results found.  Recent Labs: Lab Results  Component Value Date   WBC 7.8 07/05/2018   HGB 14.7 07/05/2018   PLT 330 07/05/2018   NA 138 07/05/2018   K 5.0 07/05/2018   CL 103 07/05/2018   CO2 28 07/05/2018   GLUCOSE 99 07/05/2018   BUN 13 07/05/2018   CREATININE 0.66 07/05/2018   BILITOT 0.9 07/05/2018   ALKPHOS 59 03/23/2017   AST 19 07/05/2018   ALT 10 07/05/2018   PROT 7.3 07/05/2018   ALBUMIN 4.3 03/23/2017   CALCIUM 9.8 07/05/2018   GFRAA 122 07/05/2018   QFTBGOLD NEGATIVE 08/23/2017    Speciality Comments: No specialty comments available.  Procedures:  Large Joint Inj: R knee on 12/12/2018 8:24 AM Indications: pain Details:  27 G 1.5 in needle, medial approach  Arthrogram: No  Medications: 1.5 mL lidocaine 1 %; 40 mg triamcinolone acetonide 40 MG/ML Aspirate: 38 mL clear; sent for lab analysis Outcome: tolerated well, no immediate complications Procedure, treatment alternatives, risks and benefits explained, specific risks discussed. Consent was given by the patient. Immediately prior to procedure a time out was called to verify the correct patient, procedure, equipment, support staff and site/side marked as required. Patient was prepped and draped in the usual sterile fashion.     Allergies: Patient has no known allergies.    Assessment / Plan:     Visit Diagnoses: Rheumatoid arthritis of multiple sites without rheumatoid factor (HCC) -  -RF, -CCP, -ANA, seronegative: She presents today with right knee pain, warmth, and a joint effusion that started  10 days prior. A right knee joint aspiration and cortisone injection was performed today.   She has no other joint pain or joint swelling.  She denies any other recent flares.  She continues to inject Humira 40 mg sq inj once weekly, MTX 7 tablets by mouth once weekly, and folic acid 2 mg po daily.  She has not missed any doses.  She does not need any refills. She was advised to increase the dose of MTX to 8 tablets po once weekly.   She will be started on a prednisone taper starting at 20 mg and taper by 5 mg every 4 days. She was advised to notify us if she develops increased joint pain or joint swelling.  She will follow up in 5 months.   High risk medication use - Humira 40 mg every 14 days, increasing to methotrexate 8 tablets every 7 days, and folic acid 2 mg daily.  Last TB gold negative on 08/23/2017.  She is due for TB gold today and will monitor yearly.  Last CBC/CMP within normal limits on 07/05/18 . Due for CBC/CMP today and will monitor every 3 months.  Standing orders in place.  - Plan: CBC with Differential/Platelet, COMPLETE METABOLIC PANEL WITH GFR,  QuantiFERON-TB Gold Plus  Plantar fasciitis: Improved   Pain in right elbow -Resolved.  She has no tenderness or inflammation at this time.  She had a cortisone injection on 07/05/18.   Chronic pain of right knee - She presents today with right knee joint pain and swelling that started 10 days ago.  She has warmth and swelling of the right knee on exam.  She has limited flexion and extension. She has not had any recent injuries.  She worked 8 shifts in a row and was on her feet most of the shifts which she feels exacerbated her discomfort. She has been taking Advil for symptomatic relief. X-rays of the right knee were obtained today.  A right knee cortisone injection was performed.  She tolerated the procedure well.  Aftercare discussed.  Plan: XR KNEE 3 VIEW RIGHT   Effusion, right knee: She presented today with a right knee joint effusion. A joint aspiration and cortisone injection was performed today.  38 ml of clear fluid was drawn off and sent to analysis.   Vitamin D deficiency: She takes a vitamin D supplement.   Other medical conditions are listed as follows:   Arnold-Chiari malformation (HCC)  History of tubal ligation    Orders: Orders Placed This Encounter  Procedures  . Large Joint Inj: R knee  . Anaerobic and Aerobic Culture  . XR KNEE 3 VIEW RIGHT  . CBC with Differential/Platelet  . COMPLETE METABOLIC PANEL WITH GFR  . QuantiFERON-TB Gold Plus  . Synovial cell count + diff, w/ crystals   Meds ordered this encounter  Medications  . predniSONE (DELTASONE) 5 MG tablet    Sig: Take 4 tablets by mouth daily x4 days, 3 tablets by mouth daily x4 days, 2 tablets by mouth daily x4 days, 1 tablet by mouth daily x4 days.    Dispense:  40 tablet    Refill:  0    Face-to-face time spent with patient was 30 minutes. Greater than 50% of time was spent in counseling and coordination of care.  Follow-Up Instructions: Return in about 5 months (around 05/12/2019) for Rheumatoid  arthritis.   Gearldine Bienenstock, PA-C  Note - This record has been created using Dragon software.  Chart creation errors  have been sought, but may not always  have been located. Such creation errors do not reflect on  the standard of medical care.

## 2018-12-12 ENCOUNTER — Ambulatory Visit (INDEPENDENT_AMBULATORY_CARE_PROVIDER_SITE_OTHER): Payer: PRIVATE HEALTH INSURANCE

## 2018-12-12 ENCOUNTER — Encounter (INDEPENDENT_AMBULATORY_CARE_PROVIDER_SITE_OTHER): Payer: Self-pay

## 2018-12-12 ENCOUNTER — Encounter: Payer: Self-pay | Admitting: Physician Assistant

## 2018-12-12 ENCOUNTER — Ambulatory Visit: Payer: PRIVATE HEALTH INSURANCE | Admitting: Physician Assistant

## 2018-12-12 VITALS — BP 126/88 | HR 93 | Resp 13 | Ht 61.0 in | Wt 142.0 lb

## 2018-12-12 DIAGNOSIS — M25561 Pain in right knee: Secondary | ICD-10-CM

## 2018-12-12 DIAGNOSIS — Z9851 Tubal ligation status: Secondary | ICD-10-CM

## 2018-12-12 DIAGNOSIS — M25521 Pain in right elbow: Secondary | ICD-10-CM

## 2018-12-12 DIAGNOSIS — Z79899 Other long term (current) drug therapy: Secondary | ICD-10-CM

## 2018-12-12 DIAGNOSIS — G8929 Other chronic pain: Secondary | ICD-10-CM | POA: Diagnosis not present

## 2018-12-12 DIAGNOSIS — M25461 Effusion, right knee: Secondary | ICD-10-CM

## 2018-12-12 DIAGNOSIS — M722 Plantar fascial fibromatosis: Secondary | ICD-10-CM

## 2018-12-12 DIAGNOSIS — E559 Vitamin D deficiency, unspecified: Secondary | ICD-10-CM | POA: Diagnosis not present

## 2018-12-12 DIAGNOSIS — Q0700 Arnold-Chiari syndrome without spina bifida or hydrocephalus: Secondary | ICD-10-CM

## 2018-12-12 DIAGNOSIS — M0609 Rheumatoid arthritis without rheumatoid factor, multiple sites: Secondary | ICD-10-CM | POA: Diagnosis not present

## 2018-12-12 MED ORDER — PREDNISONE 5 MG PO TABS: ORAL_TABLET | ORAL | 0 refills | Status: DC

## 2018-12-12 MED ORDER — TRIAMCINOLONE ACETONIDE 40 MG/ML IJ SUSP
40.0000 mg | INTRAMUSCULAR | Status: AC | PRN
  Administered 2018-12-12: 40 mg via INTRA_ARTICULAR

## 2018-12-12 MED ORDER — LIDOCAINE HCL 1 % IJ SOLN
1.5000 mL | INTRAMUSCULAR | Status: AC | PRN
  Administered 2018-12-12: 1.5 mL

## 2018-12-12 NOTE — Patient Instructions (Signed)
Standing Labs We placed an order today for your standing lab work.    Please come back and get your standing labs in May and every 3 months  We have open lab Monday through Friday from 8:30-11:30 AM and 1:30-4:00 PM  at the office of Dr. Shaili Deveshwar.   You may experience shorter wait times on Monday and Friday afternoons. The office is located at [address], [address], [place], [place] [address] No appointment is necessary.   Labs are drawn by [facility].  You may receive a bill from [facility] for your lab work.  If you wish to have your labs drawn at another location, please call the office 24 hours in advance to send orders.  If you have any questions regarding directions or hours of operation,  please call [phone].   Just as a reminder please drink plenty of water prior to coming for your lab work. Thanks!  

## 2018-12-13 NOTE — Progress Notes (Signed)
CBC stable. Glucose is 105. Rest of CMP

## 2018-12-14 LAB — CBC WITH DIFFERENTIAL/PLATELET
ABSOLUTE MONOCYTES: 221 {cells}/uL (ref 200–950)
Basophils Absolute: 77 cells/uL (ref 0–200)
Basophils Relative: 0.9 %
EOS ABS: 281 {cells}/uL (ref 15–500)
Eosinophils Relative: 3.3 %
HEMATOCRIT: 45.8 % — AB (ref 35.0–45.0)
Hemoglobin: 15.7 g/dL — ABNORMAL HIGH (ref 11.7–15.5)
LYMPHS ABS: 1488 {cells}/uL (ref 850–3900)
MCH: 29.4 pg (ref 27.0–33.0)
MCHC: 34.3 g/dL (ref 32.0–36.0)
MCV: 85.8 fL (ref 80.0–100.0)
MPV: 10.6 fL (ref 7.5–12.5)
Monocytes Relative: 2.6 %
NEUTROS PCT: 75.7 %
Neutro Abs: 6435 cells/uL (ref 1500–7800)
PLATELETS: 369 10*3/uL (ref 140–400)
RBC: 5.34 10*6/uL — AB (ref 3.80–5.10)
RDW: 11.9 % (ref 11.0–15.0)
TOTAL LYMPHOCYTE: 17.5 %
WBC: 8.5 10*3/uL (ref 3.8–10.8)

## 2018-12-14 LAB — QUANTIFERON-TB GOLD PLUS
NIL: 0.03 [IU]/mL
QUANTIFERON-TB GOLD PLUS: NEGATIVE
TB1-NIL: <0 IU/mL
TB2-NIL: 0 IU/mL

## 2018-12-14 LAB — COMPLETE METABOLIC PANEL WITH GFR
AG RATIO: 1.8 (calc) (ref 1.0–2.5)
ALKALINE PHOSPHATASE (APISO): 90 U/L (ref 31–125)
ALT: 12 U/L (ref 6–29)
AST: 21 U/L (ref 10–35)
Albumin: 4.6 g/dL (ref 3.6–5.1)
BUN: 13 mg/dL (ref 7–25)
CO2: 27 mmol/L (ref 20–32)
Calcium: 9.6 mg/dL (ref 8.6–10.2)
Chloride: 103 mmol/L (ref 98–110)
Creat: 0.7 mg/dL (ref 0.50–1.10)
GFR, Est African American: 120 mL/min/{1.73_m2} (ref >=60)
GFR, Est Non African American: 103 mL/min/{1.73_m2} (ref >=60)
GLOBULIN: 2.5 g/dL (ref 1.9–3.7)
Glucose, Bld: 105 mg/dL — ABNORMAL HIGH (ref 65–99)
Potassium: 4.7 mmol/L (ref 3.5–5.3)
SODIUM: 140 mmol/L (ref 135–146)
Total Bilirubin: 1 mg/dL (ref 0.2–1.2)
Total Protein: 7.1 g/dL (ref 6.1–8.1)

## 2018-12-14 NOTE — Progress Notes (Signed)
TB gold negative

## 2018-12-18 LAB — SYNOVIAL CELL COUNT + DIFF, W/ CRYSTALS
Basophils, %: 0 %
Eosinophils-Synovial: 0 % (ref 0–2)
Lymphocytes-Synovial Fld: 65 % (ref 0–74)
Monocyte/Macrophage: 12 % (ref 0–69)
NEUTROPHIL, SYNOVIAL: 23 % (ref 0–24)
Synoviocytes, %: 0 % (ref 0–15)
WBC, Synovial: 5650 cells/uL — ABNORMAL HIGH (ref <150)

## 2018-12-18 LAB — ANAEROBIC AND AEROBIC CULTURE
AER RESULT:: NO GROWTH
MICRO NUMBER:: 238207
MICRO NUMBER:: 238208
SPECIMEN QUALITY: ADEQUATE
SPECIMEN QUALITY:: ADEQUATE

## 2018-12-19 NOTE — Progress Notes (Signed)
Synovial fluid culture negative.  No crystals present.

## 2019-01-14 ENCOUNTER — Other Ambulatory Visit: Payer: Self-pay | Admitting: Physician Assistant

## 2019-01-16 NOTE — Telephone Encounter (Signed)
Last Visit: 12/12/18 Next Visit: 05/23/19  Okay to refill per Dr. Corliss Skains

## 2019-01-25 ENCOUNTER — Other Ambulatory Visit: Payer: Self-pay | Admitting: *Deleted

## 2019-01-25 NOTE — Telephone Encounter (Signed)
Left message to advise patient received fax from [facility] stating they have been trying to reach patient to refill Humira. They have made multiple attempts to reach patient. Call back number [phone].

## 2019-01-25 NOTE — Telephone Encounter (Signed)
Received fax from [facility] stating they have been trying to reach patient to refill Humira. They have made multiple attempts to reach patient. Call back number [phone].

## 2019-02-15 ENCOUNTER — Other Ambulatory Visit: Payer: Self-pay | Admitting: Physician Assistant

## 2019-02-15 NOTE — Telephone Encounter (Signed)
Last Visit: 12/12/2018 Next Visit: message sent to the front desk to schedule.  Labs: 12/12/2018 CBC stable. Glucose is 105. Rest of CMP TB Gold: 12/12/2018 negative   Okay to refill per Dr. Corliss Skains.

## 2019-04-20 ENCOUNTER — Other Ambulatory Visit: Payer: Self-pay | Admitting: *Deleted

## 2019-04-20 ENCOUNTER — Other Ambulatory Visit: Payer: Self-pay | Admitting: Rheumatology

## 2019-04-20 DIAGNOSIS — Z79899 Other long term (current) drug therapy: Secondary | ICD-10-CM

## 2019-04-20 NOTE — Telephone Encounter (Addendum)
Last Visit: 12/12/2018 Next Visit: 05/23/2019 Labs: 12/12/18 CBC stable. Glucose is 105. Rest of CMP TB Gold: 12/12/18 Neg   Left message for patient to advise she is due for labs.  Okay to refill 30 day supply per Dr. Estanislado Pandy

## 2019-04-24 ENCOUNTER — Other Ambulatory Visit: Payer: Self-pay

## 2019-04-24 DIAGNOSIS — Z79899 Other long term (current) drug therapy: Secondary | ICD-10-CM

## 2019-04-25 LAB — COMPLETE METABOLIC PANEL WITH GFR
AG Ratio: 1.6 (calc) (ref 1.0–2.5)
ALT: 10 U/L (ref 6–29)
AST: 15 U/L (ref 10–35)
Albumin: 4.4 g/dL (ref 3.6–5.1)
Alkaline phosphatase (APISO): 75 U/L (ref 31–125)
BUN: 19 mg/dL (ref 7–25)
CO2: 25 mmol/L (ref 20–32)
Calcium: 9.5 mg/dL (ref 8.6–10.2)
Chloride: 105 mmol/L (ref 98–110)
Creat: 0.64 mg/dL (ref 0.50–1.10)
GFR, Est African American: 122 mL/min/{1.73_m2} (ref >=60)
GFR, Est Non African American: 106 mL/min/{1.73_m2} (ref >=60)
Globulin: 2.7 g/dL (calc) (ref 1.9–3.7)
Glucose, Bld: 97 mg/dL (ref 65–99)
Potassium: 5.1 mmol/L (ref 3.5–5.3)
Sodium: 140 mmol/L (ref 135–146)
Total Bilirubin: 0.7 mg/dL (ref 0.2–1.2)
Total Protein: 7.1 g/dL (ref 6.1–8.1)

## 2019-04-25 LAB — CBC WITH DIFFERENTIAL/PLATELET
Absolute Monocytes: 502 cells/uL (ref 200–950)
Basophils Absolute: 62 cells/uL (ref 0–200)
Basophils Relative: 0.7 %
Eosinophils Absolute: 387 cells/uL (ref 15–500)
Eosinophils Relative: 4.4 %
HCT: 45.4 % — ABNORMAL HIGH (ref 35.0–45.0)
Hemoglobin: 15.1 g/dL (ref 11.7–15.5)
Lymphs Abs: 1566 cells/uL (ref 850–3900)
MCH: 29.9 pg (ref 27.0–33.0)
MCHC: 33.3 g/dL (ref 32.0–36.0)
MCV: 89.9 fL (ref 80.0–100.0)
MPV: 9.8 fL (ref 7.5–12.5)
Monocytes Relative: 5.7 %
Neutro Abs: 6283 cells/uL (ref 1500–7800)
Neutrophils Relative %: 71.4 %
Platelets: 338 10*3/uL (ref 140–400)
RBC: 5.05 10*6/uL (ref 3.80–5.10)
RDW: 12.6 % (ref 11.0–15.0)
Total Lymphocyte: 17.8 %
WBC: 8.8 10*3/uL (ref 3.8–10.8)

## 2019-05-09 NOTE — Progress Notes (Signed)
Office Visit Note  Patient: Sheena Rivera             Date of Birth: [birth year]           MRN: [id number]             PCP: Patient, No Pcp Per Referring: No ref. provider found Visit Date: 05/23/2019 Occupation: @GUAROCC @  Subjective:  Nausea due to MTX     History of Present Illness: Sheena Rivera is a 48 y.o. female with history of seronegative rheumatoid arthritis.  She is taking Humira 40 mg sq injection once every 14 days, MTX 8 tablets po once weekly, and folic acid 2 mg po daily.  She has experienced increased nausea since increasing her dose of MTX from 7 tablets to 8 tablets weekly. She would like to switch to the injectable form of MTX.  She continues to have some joint swelling in the right knee but it improved significantly after the aspiration and injection in February 2020.  She denies any other joint pain or joint swelling.      Activities of Daily Living:  Patient reports morning stiffness for 15 minutes.   Patient Denies nocturnal pain.  Difficulty dressing/grooming: Denies Difficulty climbing stairs: Denies Difficulty getting out of chair: Denies Difficulty using hands for taps, buttons, cutlery, and/or writing: Denies  Review of Systems  Constitutional: Positive for fatigue.  HENT: Positive for mouth dryness. Negative for mouth sores and nose dryness.   Eyes: Positive for dryness. Negative for pain and visual disturbance.  Respiratory: Negative for cough, hemoptysis, shortness of breath and difficulty breathing.   Cardiovascular: Negative for chest pain, palpitations, hypertension and swelling in legs/feet.  Gastrointestinal: Negative for blood in stool, constipation and diarrhea.  Endocrine: Negative for increased urination.  Genitourinary: Negative for difficulty urinating and painful urination.  Musculoskeletal: Positive for arthralgias, joint pain, joint swelling and morning stiffness. Negative for myalgias, muscle weakness, muscle tenderness and myalgias.   Skin: Negative for color change, pallor, rash, hair loss, nodules/bumps, skin tightness, ulcers and sensitivity to sunlight.  Allergic/Immunologic: Negative for susceptible to infections.  Neurological: Negative for dizziness, numbness, headaches and weakness.  Hematological: Negative for bruising/bleeding tendency and swollen glands.  Psychiatric/Behavioral: Negative for depressed mood and sleep disturbance. The patient is not nervous/anxious.     PMFS History:  Patient Active Problem List   Diagnosis Date Noted  . 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 High risk medication use 10/01/2016  . Vitamin D deficiency 10/01/2016  . Plantar fasciitis 10/01/2016    Past Medical History:  Diagnosis Date  . Arthritis   . Rheumatoid arthritis (HCC)     Family History  Problem Relation Age of Onset  . Ovarian cancer Mother   . COPD Father   . Depression Son    History reviewed. No pertinent surgical history. Social History   Social History Narrative  . Not on file   Immunization History  Administered Date(s) Administered  . Influenza-Unspecified 08/19/2014     Objective: Vital Signs: BP 114/73 (BP Location: Left Arm, Patient Position: Sitting, Cuff Size: Normal)   Pulse 81   Resp 14   Ht 5\' 1"  (1.549 m)   Wt 148 lb 5.8 oz (67.3 kg)   BMI 28.03 kg/m    Physical Exam Vitals signs and nursing note reviewed.  Constitutional:      Appearance: She is well-developed.  HENT:     Head: Normocephalic and atraumatic.  Eyes:  Conjunctiva/sclera: Conjunctivae normal.  Neck:     Musculoskeletal: Normal range of motion.  Cardiovascular:     Rate and Rhythm: Normal rate and regular rhythm.     Heart sounds: Normal heart sounds.  Pulmonary:     Effort: Pulmonary effort is normal.     Breath sounds: Normal breath sounds.  Abdominal:     General: Bowel sounds are normal.     Palpations: Abdomen is soft.  Lymphadenopathy:      Cervical: No cervical adenopathy.  Skin:    General: Skin is warm and dry.     Capillary Refill: Capillary refill takes less than 2 seconds.  Neurological:     Mental Status: She is alert and oriented to person, place, and time.  Psychiatric:        Behavior: Behavior normal.      Musculoskeletal Exam: C-spine, thoracic spine, and lumbar spine good ROM.  No midline spinal tenderness.  No SI joint tenderness.  Shoulder joints, elbow joints, wrist joints, MCPs, PIPs, and DIPs good ROM with no synovitis.  Complete fist formation bilaterally.  Hip joints good ROM with no discomfort. Right knee warmth and effusion noted.  Left knee no warmth or effusion noted.  Ankle joints, MTPs, PIPs, and DIPs good ROM with no synovitis.  No tenderness or swelling of ankle joints.    CDAI Exam: CDAI Score: - Patient Global: -; Provider Global: - Swollen: -; Tender: - Joint Exam   No joint exam has been documented for this visit   There is currently no information documented on the homunculus. Go to the Rheumatology activity and complete the homunculus joint exam.  Investigation: No additional findings.  Imaging: No results found.  Recent Labs: Lab Results  Component Value Date   WBC 8.8 04/24/2019   HGB 15.1 04/24/2019   PLT 338 04/24/2019   NA 140 04/24/2019   K 5.1 04/24/2019   CL 105 04/24/2019   CO2 25 04/24/2019   GLUCOSE 97 04/24/2019   BUN 19 04/24/2019   CREATININE 0.64 04/24/2019   BILITOT 0.7 04/24/2019   ALKPHOS 59 03/23/2017   AST 15 04/24/2019   ALT 10 04/24/2019   PROT 7.1 04/24/2019   ALBUMIN 4.3 03/23/2017   CALCIUM 9.5 04/24/2019   GFRAA 122 04/24/2019   QFTBGOLD NEGATIVE 08/23/2017   QFTBGOLDPLUS NEGATIVE 12/12/2018    Speciality Comments: No specialty comments available.  Procedures:  No procedures performed Allergies: Patient has no known allergies.   Assessment / Plan:     Visit Diagnoses: Rheumatoid arthritis of multiple sites without rheumatoid factor  (HCC) -  -RF, -CCP, -ANA, seronegative: She presents today with right knee joint warmth and effusion.  She had a right knee joint aspiration on 12/12/18, which resolved her symptoms temporarily.  She declined a right knee joint aspiration today.  She has no other joint pain or joint swelling at this time.  She has morning stiffness lasting about 15 minutes.  She has been injecting Humira 40 mg subcutaneously every 14 days and taking methotrexate 8 tablets by mouth once weekly.  She noticed some improvement when increasing her dose of methotrexate from 7 tablets to 8 tablets by mouth once weekly.  She did experience increased nausea after taking methotrexate at the higher dose.  We discussed switching her to the injectable form of methotrexate.  We will send in a prescription for Otrexup 0.8 ml sq once weekly. We also sent in a prescription for Zofran 4 mg by mouth every 8 hours  as needed for nausea.  I sent in a prednisone taper starting at 20 mg tapering by 5 mg every 2 days.  She will continue on Humira as prescribed.  She will continue to have lab work every 3 months.  She was advised to notify us if her right knee joint effusion persists or worsens.  She will follow-up in the office in 5 months.  High risk medication use -Humira 40 mg every 14 days, methotrexate 2.5 mg 7 tablets every 7 days, and folic acid 1 mg 2 tablets daily.  Last TB gold negative on 12/12/2018 and will monitor yearly.  Most recent CBC/CMP within normal limits on 04/24/2019.  Due for CBC/CMP in October and will monitor every 3 months.  Standing orders are in place.  I discussed the importance of holding methotrexate and Humira if she develops any signs or symptoms of an infection and to resume once the infection is completely cleared.  I also discussed the importance of scheduling yearly skin exams while on Humira due to the increased risk of melanoma.  Right shoulder tendonitis-Resolved   Effusion, right knee -She presents today with a  right knee warmth and effusion. No erythema was noted.  She has good range of motion with some discomfort.  She had a right knee joint aspiration and cortisone injection on 12/12/2018 which improved her symptoms temporarily.  She declined a joint aspiration today.  We will send in a prednisone taper starting at 20 mg and taper by 5 mg every 2 days.  We will also be switching her to the injectable form of methotrexate.  She will continue on Humira as prescribed.  Plantar fasciitis - Resolved   Other medical conditions are listed as follows:  Vitamin D deficiency  Arnold-Chiari malformation ([place])   History of tubal ligation   Orders: No orders of the defined types were placed in this encounter.  Meds ordered this encounter  Medications  . ondansetron (ZOFRAN) 4 MG tablet    Sig: Take 1 tablet (4 mg total) by mouth every 8 (eight) hours as needed for nausea or vomiting.    Dispense:  20 tablet    Refill:  0    Face-to-face time spent with patient was 30 minutes. Greater than 50% of time was spent in counseling and coordination of care.  Follow-Up Instructions: Return in about 5 months (around 10/23/2019) for Rheumatoid arthritis.   Ofilia Neas, PA-C  Note - This record has been created using Dragon software.  Chart creation errors have been sought, but may not always  have been located. Such creation errors do not reflect on  the standard of medical care.

## 2019-05-23 ENCOUNTER — Telehealth: Payer: Self-pay | Admitting: Pharmacist

## 2019-05-23 ENCOUNTER — Ambulatory Visit: Payer: PRIVATE HEALTH INSURANCE | Admitting: Physician Assistant

## 2019-05-23 ENCOUNTER — Encounter: Payer: Self-pay | Admitting: Physician Assistant

## 2019-05-23 ENCOUNTER — Other Ambulatory Visit: Payer: Self-pay

## 2019-05-23 VITALS — BP 114/73 | HR 81 | Resp 14 | Ht 61.0 in | Wt 148.4 lb

## 2019-05-23 DIAGNOSIS — Z9851 Tubal ligation status: Secondary | ICD-10-CM

## 2019-05-23 DIAGNOSIS — Z79899 Other long term (current) drug therapy: Secondary | ICD-10-CM

## 2019-05-23 DIAGNOSIS — M722 Plantar fascial fibromatosis: Secondary | ICD-10-CM | POA: Diagnosis not present

## 2019-05-23 DIAGNOSIS — M25461 Effusion, right knee: Secondary | ICD-10-CM

## 2019-05-23 DIAGNOSIS — M0609 Rheumatoid arthritis without rheumatoid factor, multiple sites: Secondary | ICD-10-CM

## 2019-05-23 DIAGNOSIS — Q0700 Arnold-Chiari syndrome without spina bifida or hydrocephalus: Secondary | ICD-10-CM

## 2019-05-23 DIAGNOSIS — M7581 Other shoulder lesions, right shoulder: Secondary | ICD-10-CM

## 2019-05-23 DIAGNOSIS — E559 Vitamin D deficiency, unspecified: Secondary | ICD-10-CM | POA: Diagnosis not present

## 2019-05-23 DIAGNOSIS — M778 Other enthesopathies, not elsewhere classified: Secondary | ICD-10-CM

## 2019-05-23 MED ORDER — ONDANSETRON HCL 4 MG PO TABS: 4.0000 mg | ORAL_TABLET | Freq: Three times a day (TID) | ORAL | 0 refills | Status: DC | PRN

## 2019-05-23 MED ORDER — PREDNISONE 5 MG PO TABS: ORAL_TABLET | ORAL | 0 refills | Status: DC

## 2019-05-23 NOTE — Telephone Encounter (Signed)
Please start benefits investigation for injectable methotrexate Otrexup.  She is unable to tolerate oral methotrexate due to nausea.

## 2019-05-23 NOTE — Patient Instructions (Signed)
Standing Labs We placed an order today for your standing lab work.    Please come back and get your standing labs in October and every 3 months   We have open lab daily Monday through Thursday from 8:30-12:30 PM and 1:30-4:30 PM and Friday from 8:30-12:30 PM and 1:30 -4:00 PM at the office of Dr. Shaili Deveshwar.   You may experience shorter wait times on Monday and Friday afternoons. The office is located at [address], [address], [place], [place] [address] No appointment is necessary.   Labs are drawn by [facility].  You may receive a bill from [facility] for your lab work.  If you wish to have your labs drawn at another location, please call the office 24 hours in advance to send orders.  If you have any questions regarding directions or hours of operation,  please call [phone].   Just as a reminder please drink plenty of water prior to coming for your lab work. Thanks!   

## 2019-05-23 NOTE — Telephone Encounter (Signed)
Submitted a Prior Authorization request to [facility] for Industry via Cover My Meds. Will update once we receive a response.  9:35 AM Beatriz Chancellor, CPhT

## 2019-05-25 NOTE — Telephone Encounter (Signed)
Received a fax regarding Prior Authorization from [facility] for [place]. Authorization has been DENIED because patient has not tried or can not use Rasuvo.  Will send document to scan center.  Can you please do a test claim for Rasuvo?

## 2019-05-25 NOTE — Telephone Encounter (Signed)
Ran test claim, Rasuvo requires PA.  Submitted a Prior Authorization request to [facility] for RASUVO via Cover My Meds. Will update once we receive a response.  8:27 AM Beatriz Chancellor, CPhT

## 2019-05-29 ENCOUNTER — Encounter: Payer: Self-pay | Admitting: Pharmacist

## 2019-05-29 NOTE — Progress Notes (Signed)
Appeal for Rasuvo

## 2019-05-29 NOTE — Telephone Encounter (Signed)
Called plan and determination has already been DENIED due to lack of additional information. Appeal must be submitted.  Phone# [phone]  12:38 PM Beatriz Chancellor, CPhT

## 2019-05-29 NOTE — Telephone Encounter (Signed)
Appeal letter faxed to [phone].  Will update when we receive a response.

## 2019-05-30 MED ORDER — RASUVO 20 MG/0.4ML ~~LOC~~ SOAJ: 20.0000 mg | SUBCUTANEOUS | 0 refills | Status: DC

## 2019-05-30 NOTE — Telephone Encounter (Signed)
Left voicemail notifying patient of approval and for a return call to let us know where she would like the prescription sent.

## 2019-05-30 NOTE — Telephone Encounter (Signed)
Received notification from [facility] regarding a prior authorization for RASUVO. Authorization has been APPROVED from 05/30/2019 to 05/29/2020.   Will send document to scan center.  Authorization # [id number]

## 2019-05-30 NOTE — Telephone Encounter (Signed)
Patient returned call.  She would like [place] to send to her local [facility] in [place].  Informed patient that sometimes insurance will require Rasuvo be filled through a specialty pharmacy.  Patient verbalized understanding and will call if [facility] is unable to fill.  She does not need injection training as she is already on an injectable medication Humira.  Counseled on Rasuvo specific training including to pull cap straight off in order to prevent break in plastic and that a few drops may leak from the pen after cap is removed.  Patient verbalized understanding.  All questions encouraged and answered.  Instructed patient to call with any further questions or concerns.  Mariella Saa, PharmD, [place], [place] [facility] Pharmacist [phone]  05/30/2019 11:07 AM

## 2019-05-31 ENCOUNTER — Other Ambulatory Visit: Payer: Self-pay | Admitting: Rheumatology

## 2019-05-31 NOTE — Telephone Encounter (Signed)
Last Visit: 05/23/19 Next Visit: 10/25/19 Labs: 04/24/19 WNL TB Gold: 12/12/18 Neg  Okay to refill per Dr. Estanislado Pandy

## 2019-08-03 ENCOUNTER — Other Ambulatory Visit: Payer: Self-pay | Admitting: Rheumatology

## 2019-08-03 NOTE — Telephone Encounter (Signed)
Last Visit: 05/23/19 Next Visit: 10/25/19  Okay to refill per Dr. Estanislado Pandy

## 2019-08-09 ENCOUNTER — Other Ambulatory Visit: Payer: Self-pay | Admitting: Rheumatology

## 2019-08-09 NOTE — Telephone Encounter (Signed)
Last Visit: 05/23/19 Next Visit: 10/25/19 Labs: 04/24/19 WNL TB Gold: 12/12/18 Neg  Left message to advise patient she is due to update labs.  Okay to refill 30 day supply per Dr. Estanislado Pandy

## 2019-08-21 ENCOUNTER — Other Ambulatory Visit: Payer: Self-pay

## 2019-08-21 DIAGNOSIS — Z79899 Other long term (current) drug therapy: Secondary | ICD-10-CM

## 2019-08-22 LAB — CBC WITH DIFFERENTIAL/PLATELET
Absolute Monocytes: 580 cells/uL (ref 200–950)
Basophils Absolute: 59 cells/uL (ref 0–200)
Basophils Relative: 0.7 %
Eosinophils Absolute: 286 cells/uL (ref 15–500)
Eosinophils Relative: 3.4 %
HCT: 46.4 % — ABNORMAL HIGH (ref 35.0–45.0)
Hemoglobin: 15.5 g/dL (ref 11.7–15.5)
Lymphs Abs: 1823 cells/uL (ref 850–3900)
MCH: 29.5 pg (ref 27.0–33.0)
MCHC: 33.4 g/dL (ref 32.0–36.0)
MCV: 88.2 fL (ref 80.0–100.0)
MPV: 10.8 fL (ref 7.5–12.5)
Monocytes Relative: 6.9 %
Neutro Abs: 5653 cells/uL (ref 1500–7800)
Neutrophils Relative %: 67.3 %
Platelets: 313 10*3/uL (ref 140–400)
RBC: 5.26 10*6/uL — ABNORMAL HIGH (ref 3.80–5.10)
RDW: 12.5 % (ref 11.0–15.0)
Total Lymphocyte: 21.7 %
WBC: 8.4 10*3/uL (ref 3.8–10.8)

## 2019-08-22 LAB — COMPLETE METABOLIC PANEL WITH GFR
AG Ratio: 1.6 (calc) (ref 1.0–2.5)
ALT: 16 U/L (ref 6–29)
AST: 22 U/L (ref 10–35)
Albumin: 4.5 g/dL (ref 3.6–5.1)
Alkaline phosphatase (APISO): 82 U/L (ref 31–125)
BUN: 13 mg/dL (ref 7–25)
CO2: 24 mmol/L (ref 20–32)
Calcium: 9.7 mg/dL (ref 8.6–10.2)
Chloride: 103 mmol/L (ref 98–110)
Creat: 0.62 mg/dL (ref 0.50–1.10)
GFR, Est African American: 124 mL/min/{1.73_m2} (ref >=60)
GFR, Est Non African American: 107 mL/min/{1.73_m2} (ref >=60)
Globulin: 2.8 g/dL (calc) (ref 1.9–3.7)
Glucose, Bld: 100 mg/dL — ABNORMAL HIGH (ref 65–99)
Potassium: 4.7 mmol/L (ref 3.5–5.3)
Sodium: 140 mmol/L (ref 135–146)
Total Bilirubin: 0.9 mg/dL (ref 0.2–1.2)
Total Protein: 7.3 g/dL (ref 6.1–8.1)

## 2019-08-22 NOTE — Progress Notes (Signed)
Within normal limits

## 2019-08-23 ENCOUNTER — Other Ambulatory Visit: Payer: Self-pay | Admitting: Physician Assistant

## 2019-08-23 DIAGNOSIS — M0609 Rheumatoid arthritis without rheumatoid factor, multiple sites: Secondary | ICD-10-CM

## 2019-08-24 NOTE — Telephone Encounter (Signed)
Last Visit: 05/23/19 Next Visit: 10/25/19 Labs: 08/21/19 WNL  Okay to refill per Dr. Estanislado Pandy

## 2019-09-05 ENCOUNTER — Other Ambulatory Visit: Payer: Self-pay | Admitting: Rheumatology

## 2019-09-05 NOTE — Telephone Encounter (Signed)
Last Visit:05/23/19 Next Visit:10/25/19 Labs: 08/21/19  WNL TB Gold:12/12/18 Neg  Okay to refill per Dr. Estanislado Pandy

## 2019-10-25 ENCOUNTER — Ambulatory Visit: Payer: PRIVATE HEALTH INSURANCE | Admitting: Rheumatology

## 2019-11-08 ENCOUNTER — Telehealth: Payer: Self-pay | Admitting: Rheumatology

## 2019-11-08 NOTE — Telephone Encounter (Signed)
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 Patient wants to make sure she can get the vaccine with RA. Please call to advise.

## 2019-11-08 NOTE — Telephone Encounter (Signed)
Patient advised per Dr. Corliss Skains she recommend the Covid vaccine. Patient advised it is not a live virus therefor she does not need to hold her medications. Patient verbalized understanding.

## 2019-11-10 ENCOUNTER — Other Ambulatory Visit: Payer: Self-pay | Admitting: Rheumatology

## 2019-11-15 ENCOUNTER — Telehealth: Payer: Self-pay | Admitting: Rheumatology

## 2019-11-15 MED ORDER — HUMIRA (2 PEN) 40 MG/0.4ML ~~LOC~~ AJKT: 40.0000 mg | AUTO-INJECTOR | SUBCUTANEOUS | 0 refills | Status: DC

## 2019-11-15 NOTE — Telephone Encounter (Signed)
Patient called requesting prescription refill of Humira to be sent to [facility].

## 2019-11-15 NOTE — Telephone Encounter (Signed)
Last Visit:05/23/19 Next Visit:12/21/19 08/21/19 WNL TB Gold:12/12/18 Neg  Left message to advise patient she is due to update labs.  Okay to refill 30 day supply per Dr. Corliss Skains

## 2019-11-16 ENCOUNTER — Other Ambulatory Visit: Payer: Self-pay | Admitting: Rheumatology

## 2019-11-16 DIAGNOSIS — M0609 Rheumatoid arthritis without rheumatoid factor, multiple sites: Secondary | ICD-10-CM

## 2019-11-16 NOTE — Telephone Encounter (Signed)
Last Visit:05/23/19 Next Visit: 12/21/19 Labs: 08/21/19 WNL  Okay to refill per Dr. Corliss Skains

## 2019-11-24 ENCOUNTER — Other Ambulatory Visit: Payer: Self-pay

## 2019-11-24 ENCOUNTER — Telehealth: Payer: Self-pay | Admitting: Rheumatology

## 2019-11-24 DIAGNOSIS — Z79899 Other long term (current) drug therapy: Secondary | ICD-10-CM

## 2019-11-24 NOTE — Telephone Encounter (Signed)
Submitted a Prior Authorization request to [facility] for HUMIRA via Cover My Meds. Will update once we receive a response.  [id number]

## 2019-11-24 NOTE — Telephone Encounter (Signed)
Patient's pharmacy needs prior authorization on Humira. Patient pharmacy uses [facility]. Patient has 1 more dose.

## 2019-11-24 NOTE — Telephone Encounter (Signed)
Received notification from [facility] regarding a prior authorization for HUMIRA. Authorization has been APPROVED from 11/24/19 to 11/23/20.   Authorization # [id number] Phone # [phone]  9:56 AM Dorthula Nettles, CPhT

## 2019-11-24 NOTE — Telephone Encounter (Signed)
Called patient, left message to advise.

## 2019-11-25 LAB — COMPLETE METABOLIC PANEL WITH GFR
AG Ratio: 1.6 (calc) (ref 1.0–2.5)
ALT: 8 U/L (ref 6–29)
AST: 15 U/L (ref 10–35)
Albumin: 4.4 g/dL (ref 3.6–5.1)
Alkaline phosphatase (APISO): 76 U/L (ref 31–125)
BUN: 11 mg/dL (ref 7–25)
CO2: 28 mmol/L (ref 20–32)
Calcium: 9.8 mg/dL (ref 8.6–10.2)
Chloride: 104 mmol/L (ref 98–110)
Creat: 0.64 mg/dL (ref 0.50–1.10)
GFR, Est African American: 122 mL/min/{1.73_m2} (ref >=60)
GFR, Est Non African American: 106 mL/min/{1.73_m2} (ref >=60)
Globulin: 2.7 g/dL (calc) (ref 1.9–3.7)
Glucose, Bld: 105 mg/dL — ABNORMAL HIGH (ref 65–99)
Potassium: 4.7 mmol/L (ref 3.5–5.3)
Sodium: 141 mmol/L (ref 135–146)
Total Bilirubin: 0.8 mg/dL (ref 0.2–1.2)
Total Protein: 7.1 g/dL (ref 6.1–8.1)

## 2019-11-25 LAB — CBC WITH DIFFERENTIAL/PLATELET
Absolute Monocytes: 504 cells/uL (ref 200–950)
Basophils Absolute: 72 cells/uL (ref 0–200)
Basophils Relative: 1.2 %
Eosinophils Absolute: 300 cells/uL (ref 15–500)
Eosinophils Relative: 5 %
HCT: 44.6 % (ref 35.0–45.0)
Hemoglobin: 15.2 g/dL (ref 11.7–15.5)
Lymphs Abs: 1548 cells/uL (ref 850–3900)
MCH: 29.4 pg (ref 27.0–33.0)
MCHC: 34.1 g/dL (ref 32.0–36.0)
MCV: 86.3 fL (ref 80.0–100.0)
MPV: 10.6 fL (ref 7.5–12.5)
Monocytes Relative: 8.4 %
Neutro Abs: 3576 cells/uL (ref 1500–7800)
Neutrophils Relative %: 59.6 %
Platelets: 334 10*3/uL (ref 140–400)
RBC: 5.17 10*6/uL — ABNORMAL HIGH (ref 3.80–5.10)
RDW: 12 % (ref 11.0–15.0)
Total Lymphocyte: 25.8 %
WBC: 6 10*3/uL (ref 3.8–10.8)

## 2019-12-08 ENCOUNTER — Other Ambulatory Visit: Payer: Self-pay | Admitting: Rheumatology

## 2019-12-08 NOTE — Telephone Encounter (Signed)
Last Visit:05/23/19 Next Visit: 12/21/19 Labs: 11/24/19 CMP WNL. RBC count is borderline elevated but trending down. Rest of CBC WNL TB Gold 12/12/18 Neg   Okay to refill per Dr. Corliss Skains

## 2019-12-15 NOTE — Progress Notes (Signed)
Office Visit Note  Patient: Sheena Rivera             Date of Birth: [birth year]           MRN: [id number]             PCP: Patient, No Pcp Per Referring: No ref. provider found Visit Date: 12/21/2019 Occupation: @GUAROCC @  Subjective:  Right knee joint pain and swelling   History of Present Illness: Sheena Rivera is a 49 y.o. female with history of seronegative rheumatoid arthritis.  She is on Humira 40 mg sq injections every 14 days and Rasuvo 20 mg sq injections once weekly.  She states she received her second covid-19 vaccine on 12/19/19 and held her Humira injection that day.  She did not skip any doses of Rasuvo.  She presents today with a right knee joint effusion.  She requested to have the right knee aspirated and have a cortisone injection today.  She denies any other joint pain or joint swelling at this time.    Activities of Daily Living:  Patient reports morning stiffness for 0 minutes.   Patient Denies nocturnal pain.  Difficulty dressing/grooming: Denies Difficulty climbing stairs: Reports Difficulty getting out of chair: Denies Difficulty using hands for taps, buttons, cutlery, and/or writing: Denies  Review of Systems  Constitutional: Positive for fatigue.  HENT: Negative for mouth sores, mouth dryness and nose dryness.   Eyes: Negative for itching and dryness.  Respiratory: Negative for shortness of breath, wheezing and difficulty breathing.   Cardiovascular: Negative for chest pain, palpitations and swelling in legs/feet.  Gastrointestinal: Negative for abdominal pain, blood in stool, constipation and diarrhea.  Endocrine: Negative for increased urination.  Genitourinary: Negative for difficulty urinating and painful urination.  Musculoskeletal: Positive for arthralgias, joint pain and joint swelling. Negative for morning stiffness.  Skin: Negative for color change, rash and hair loss.  Allergic/Immunologic: Negative for susceptible to infections.  Neurological:  Negative for dizziness, headaches, memory loss and weakness.  Hematological: Negative for bruising/bleeding tendency.  Psychiatric/Behavioral: Negative for confusion and sleep disturbance.    PMFS History:  Patient Active Problem List   Diagnosis Date Noted   Rheumatoid arthritis of multiple sites without rheumatoid factor ([place]) 10/01/2016   High risk medication use 10/01/2016   Vitamin D deficiency 10/01/2016   Plantar fasciitis 10/01/2016    Past Medical History:  Diagnosis Date   Arthritis    Rheumatoid arthritis ([place])     Family History  Problem Relation Age of Onset   Ovarian cancer Mother    COPD Father    Depression Son    History reviewed. No pertinent surgical history. Social History   Social History Narrative   Not on file   Immunization History  Administered Date(s) Administered   Influenza-Unspecified 08/19/2014     Objective: Vital Signs: BP 119/79 (BP Location: Left Arm, Patient Position: Sitting, Cuff Size: Normal)    Pulse 82    Resp 12    Ht 5\' 1"  (1.549 m)    Wt 147 lb (66.7 kg)    BMI 27.78 kg/m    Physical Exam Vitals and nursing note reviewed.  Constitutional:      Appearance: She is well-developed.  HENT:     Head: Normocephalic and atraumatic.  Eyes:     Conjunctiva/sclera: Conjunctivae normal.  Pulmonary:     Effort: Pulmonary effort is normal.  Abdominal:     General: Bowel sounds are normal.     Palpations: Abdomen is soft.  Musculoskeletal:     Cervical back: Normal range of motion.  Lymphadenopathy:     Cervical: No cervical adenopathy.  Skin:    General: Skin is warm and dry.     Capillary Refill: Capillary refill takes less than 2 seconds.  Neurological:     Mental Status: She is alert and oriented to person, place, and time.  Psychiatric:        Behavior: Behavior normal.      Musculoskeletal Exam: C-spine, thoracic spine, lumbar spine good range of motion.  No midline spinal tenderness.  No significant  tenderness.  Shoulder joints, elbow joints, wrist joints, MCPs, PIPs and DIPs good range of motion no synovitis.  Complete fist formation bilaterally.  Hip joints have good range of motion with no discomfort.  Right knee has a moderate effusion with warmth.  Left knee has good range of motion no warmth or effusion.  Ankle joints have good range of motion no tenderness or synovitis.  CDAI Exam: CDAI Score: 2.8  Patient Global: 4 mm; Provider Global: 4 mm Swollen: 1 ; Tender: 1  Joint Exam 12/21/2019      Right  Left  Knee  Swollen Tender        Investigation: No additional findings.  Imaging: No results found.  Recent Labs: Lab Results  Component Value Date   WBC 6.0 11/24/2019   HGB 15.2 11/24/2019   PLT 334 11/24/2019   NA 141 11/24/2019   K 4.7 11/24/2019   CL 104 11/24/2019   CO2 28 11/24/2019   GLUCOSE 105 (H) 11/24/2019   BUN 11 11/24/2019   CREATININE 0.64 11/24/2019   BILITOT 0.8 11/24/2019   ALKPHOS 59 03/23/2017   AST 15 11/24/2019   ALT 8 11/24/2019   PROT 7.1 11/24/2019   ALBUMIN 4.3 03/23/2017   CALCIUM 9.8 11/24/2019   GFRAA 122 11/24/2019   QFTBGOLD NEGATIVE 08/23/2017   QFTBGOLDPLUS NEGATIVE 12/12/2018    Speciality Comments: No specialty comments available.  Procedures:  Large Joint Inj: R knee on 12/21/2019 8:48 AM Indications: pain Details: 27 G 1.5 in needle, medial approach  Arthrogram: No  Medications: 3 mL lidocaine 1 %; 60 mg triamcinolone acetonide 40 MG/ML Aspirate: 10 mL Outcome: tolerated well, no immediate complications Procedure, treatment alternatives, risks and benefits explained, specific risks discussed. Consent was given by the patient. Immediately prior to procedure a time out was called to verify the correct patient, procedure, equipment, support staff and site/side marked as required. Patient was prepped and draped in the usual sterile fashion.     Allergies: Patient has no known allergies.   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  Warmth was noted but no erythema was apparent.  She has no other joint pain or synovitis on exam.  Overall she is clinically been doing well on Humira 40 mg subcutaneous injections every 14 days, Rasuvo 20 mg subcutaneous injections once weekly, folic acid 2 mg by mouth daily.  Her second Covid vaccine was administered on 12/19/2019 and the patient held her Humira dose that day.  She states that since she missed her Humira dose her right knee joint has become inflamed.  A right knee joint aspiration and cortisone injection was performed today.  10 mL of fluid was drawn off of her right knee and 60 mg of Kenalog was injected.  She  will continue on Humira and Rasuvo as prescribed.  She was advised to notify us if she develops increased joint pain or joint swelling.  She will follow-up in the office in 5 months.  High risk medication use - Humira 40 mg every 14 days, rasuvo 20mg  subcu every 7 days, and folic acid 1 mg 2 tablets daily.  CBC and CMP are stable on 11/24/2019.  She will be due to update lab work in May and every 3 months to monitor for drug toxicity.  Standing orders are in place.  She is due to update TB gold today.  We discussed the importance of holding Humira and Rasuvo if she develops any signs or symptoms of an infection and to resume once the infection is completely cleared.  We also discussed the importance of having yearly skin exams while on Humira.  She saw her dermatologist in February 2021.  The patient has received both COVID-19 vaccinations.- Plan: QuantiFERON-TB Gold Plus  Effusion, right knee - She had a right knee joint aspiration and cortisone injection on 12/12/2018 which improved her symptoms significantly.  She presents today with a moderate right knee joint effusion which started 2 days ago after missing her  Humira injection.  A right knee joint aspiration cortisone injection was performed today on 12/21/2019.  She tolerated the procedure well.  The procedure note was completed above.  Aftercare was discussed.  She will continue on Humira and Rasuvo as prescribed.  She was advised to notify 02/20/2020 if the right knee joint effusion becomes recurrent.  Right shoulder tendonitis - Resolved  Plantar fasciitis - Resolved.   Other medical conditions are listed as follows:   Vitamin D deficiency  Arnold-Chiari malformation (HCC)  History of tubal ligation   Orders: Orders Placed This Encounter  Procedures   Large Joint Inj: R knee   QuantiFERON-TB Gold Plus   No orders of the defined types were placed in this encounter.    Follow-Up Instructions: Return in about 5 months (around 05/22/2020) for Rheumatoid arthritis.   07/22/2020, PA-C  I examined and evaluated the patient with Gearldine Bienenstock PA.  Patient is having a flare as she stopped Humira.  She had warmth and swelling in her right knee joint.  After informed consent was obtained right knee joint was aspirated as described above.  She tolerated the procedure well.  Post procedure precautions were discussed.  The plan of care was discussed as noted above.  Sherron Ales, MD Note - This record has been created using Pollyann Savoy.  Chart creation errors have been sought, but may not always  have been located. Such creation errors do not reflect on  the standard of medical care.

## 2019-12-21 ENCOUNTER — Other Ambulatory Visit: Payer: Self-pay

## 2019-12-21 ENCOUNTER — Encounter: Payer: Self-pay | Admitting: Physician Assistant

## 2019-12-21 ENCOUNTER — Ambulatory Visit: Payer: PRIVATE HEALTH INSURANCE | Admitting: Physician Assistant

## 2019-12-21 VITALS — BP 119/79 | HR 82 | Resp 12 | Ht 61.0 in | Wt 147.0 lb

## 2019-12-21 DIAGNOSIS — E559 Vitamin D deficiency, unspecified: Secondary | ICD-10-CM

## 2019-12-21 DIAGNOSIS — Q0700 Arnold-Chiari syndrome without spina bifida or hydrocephalus: Secondary | ICD-10-CM

## 2019-12-21 DIAGNOSIS — M0609 Rheumatoid arthritis without rheumatoid factor, multiple sites: Secondary | ICD-10-CM | POA: Diagnosis not present

## 2019-12-21 DIAGNOSIS — Z79899 Other long term (current) drug therapy: Secondary | ICD-10-CM

## 2019-12-21 DIAGNOSIS — Z9851 Tubal ligation status: Secondary | ICD-10-CM

## 2019-12-21 DIAGNOSIS — M778 Other enthesopathies, not elsewhere classified: Secondary | ICD-10-CM

## 2019-12-21 DIAGNOSIS — M25461 Effusion, right knee: Secondary | ICD-10-CM | POA: Diagnosis not present

## 2019-12-21 DIAGNOSIS — M722 Plantar fascial fibromatosis: Secondary | ICD-10-CM | POA: Diagnosis not present

## 2019-12-21 MED ORDER — LIDOCAINE HCL 1 % IJ SOLN
3.0000 mL | INTRAMUSCULAR | Status: AC | PRN
  Administered 2019-12-21: 3 mL

## 2019-12-21 MED ORDER — TRIAMCINOLONE ACETONIDE 40 MG/ML IJ SUSP
60.0000 mg | INTRAMUSCULAR | Status: AC | PRN
  Administered 2019-12-21: 60 mg via INTRA_ARTICULAR

## 2019-12-21 NOTE — Patient Instructions (Signed)
Standing Labs We placed an order today for your standing lab work.    Please come back and get your standing labs in May and every 3 months   We have open lab daily Monday through Thursday from 8:30-12:30 PM and 1:30-4:30 PM and Friday from 8:30-12:30 PM and 1:30-4:00 PM at the office of Dr. Shaili Deveshwar.   You may experience shorter wait times on Monday and Friday afternoons. The office is located at [address], [address], [place], [place] [address] No appointment is necessary.   Labs are drawn by [facility].  You may receive a bill from [facility] for your lab work.  If you wish to have your labs drawn at another location, please call the office 24 hours in advance to send orders.  If you have any questions regarding directions or hours of operation,  please call [phone].   Just as a reminder please drink plenty of water prior to coming for your lab work. Thanks!   

## 2019-12-23 LAB — QUANTIFERON-TB GOLD PLUS
Mitogen-NIL: 9.7 IU/mL
NIL: 0.04 IU/mL
QuantiFERON-TB Gold Plus: NEGATIVE
TB1-NIL: 0.01 IU/mL
TB2-NIL: 0 IU/mL

## 2019-12-25 NOTE — Progress Notes (Signed)
TB gold negative

## 2020-01-11 ENCOUNTER — Other Ambulatory Visit: Payer: Self-pay | Admitting: Rheumatology

## 2020-01-11 NOTE — Telephone Encounter (Signed)
Last Visit: 12/21/19 Next Visit: 05/23/20 Labs: 11/24/19 CMP WNL. RBC count is borderline elevated but trending down. Rest of CBC WNL TB Gold: 12/21/19 Neg   Okay to refill per Dr. Corliss Skains

## 2020-02-07 ENCOUNTER — Telehealth: Payer: Self-pay

## 2020-02-07 DIAGNOSIS — M0609 Rheumatoid arthritis without rheumatoid factor, multiple sites: Secondary | ICD-10-CM

## 2020-02-07 MED ORDER — RASUVO 20 MG/0.4ML ~~LOC~~ SOAJ: SUBCUTANEOUS | 0 refills | Status: DC

## 2020-02-07 NOTE — Telephone Encounter (Signed)
Refill request received via fax from [facility] on [address]. In [place] for rasuvo.   Last Visit: 12/21/2019 Next Visit: 05/23/2020 Labs: 11/24/2019 CMP WNL. RBC count is borderline elevated but trending down. Rest of CBC WNL  Okay to refill per Dr. Corliss Skains.

## 2020-02-23 ENCOUNTER — Other Ambulatory Visit: Payer: Self-pay | Admitting: Rheumatology

## 2020-02-23 NOTE — Telephone Encounter (Signed)
Last Visit: 12/21/19 Next Visit: 05/23/20  Okay to refill per Dr. Corliss Skains

## 2020-04-11 ENCOUNTER — Other Ambulatory Visit: Payer: Self-pay | Admitting: Rheumatology

## 2020-04-11 NOTE — Telephone Encounter (Signed)
Need labs prior to refill.

## 2020-04-11 NOTE — Telephone Encounter (Addendum)
Last Visit: 12/21/19 Next Visit: 05/23/20 Labs:11/24/19 CMP WNL. RBC count is borderline elevated but trending down. Rest of CBC WNL TB Gold: 12/21/19 Neg   Current Dose per office note on 12/21/2019: Humira 40 mg subcutaneous injections every 14 days DX: Rheumatoid arthritis   Left message to advise patient she is due to update labs.   Okay to refill Humira?

## 2020-04-17 ENCOUNTER — Other Ambulatory Visit: Payer: Self-pay

## 2020-04-17 DIAGNOSIS — Z79899 Other long term (current) drug therapy: Secondary | ICD-10-CM

## 2020-04-18 LAB — CBC WITH DIFFERENTIAL/PLATELET
Absolute Monocytes: 567 cells/uL (ref 200–950)
Basophils Absolute: 54 cells/uL (ref 0–200)
Basophils Relative: 0.6 %
Eosinophils Absolute: 369 cells/uL (ref 15–500)
Eosinophils Relative: 4.1 %
HCT: 41.9 % (ref 35.0–45.0)
Hemoglobin: 14.1 g/dL (ref 11.7–15.5)
Lymphs Abs: 2592 cells/uL (ref 850–3900)
MCH: 29.7 pg (ref 27.0–33.0)
MCHC: 33.7 g/dL (ref 32.0–36.0)
MCV: 88.4 fL (ref 80.0–100.0)
MPV: 10.9 fL (ref 7.5–12.5)
Monocytes Relative: 6.3 %
Neutro Abs: 5418 cells/uL (ref 1500–7800)
Neutrophils Relative %: 60.2 %
Platelets: 322 10*3/uL (ref 140–400)
RBC: 4.74 10*6/uL (ref 3.80–5.10)
RDW: 11.8 % (ref 11.0–15.0)
Total Lymphocyte: 28.8 %
WBC: 9 10*3/uL (ref 3.8–10.8)

## 2020-04-18 LAB — COMPLETE METABOLIC PANEL WITH GFR
AG Ratio: 1.7 (calc) (ref 1.0–2.5)
ALT: 6 U/L (ref 6–29)
AST: 15 U/L (ref 10–35)
Albumin: 4.4 g/dL (ref 3.6–5.1)
Alkaline phosphatase (APISO): 78 U/L (ref 31–125)
BUN: 11 mg/dL (ref 7–25)
CO2: 30 mmol/L (ref 20–32)
Calcium: 9.8 mg/dL (ref 8.6–10.2)
Chloride: 104 mmol/L (ref 98–110)
Creat: 0.79 mg/dL (ref 0.50–1.10)
GFR, Est African American: 102 mL/min/{1.73_m2} (ref >=60)
GFR, Est Non African American: 88 mL/min/{1.73_m2} (ref >=60)
Globulin: 2.6 g/dL (calc) (ref 1.9–3.7)
Glucose, Bld: 94 mg/dL (ref 65–99)
Potassium: 5 mmol/L (ref 3.5–5.3)
Sodium: 141 mmol/L (ref 135–146)
Total Bilirubin: 0.8 mg/dL (ref 0.2–1.2)
Total Protein: 7 g/dL (ref 6.1–8.1)

## 2020-04-18 NOTE — Progress Notes (Signed)
CBC and CMP normal

## 2020-05-02 ENCOUNTER — Telehealth: Payer: Self-pay | Admitting: Pharmacy Technician

## 2020-05-02 NOTE — Telephone Encounter (Signed)
Submitted a Prior Authorization request to [facility] for RASUVO via Cover My Meds. Will update once we receive a response.   (Key: [id number]) - [id number]

## 2020-05-03 NOTE — Telephone Encounter (Signed)
Received notification from [facility] regarding a prior authorization for RASUVO. Authorization has been APPROVED from 05/02/20 to 05/02/21.   Authorization # [id number]

## 2020-05-09 NOTE — Progress Notes (Signed)
Office Visit Note  Patient: Sheena Rivera             Date of Birth: 27-May-1971           MRN: [id number]             PCP: Patient, No Pcp Per Referring: No ref. provider found Visit Date: 05/23/2020 Occupation: @GUAROCC @  Subjective:  Medication monitoring   History of Present Illness: Sheena Rivera is a 49 y.o. female with seronegative rheumatoid arthritis.  She is on Humira 40 mg sq injections every 14 days, Rasuvo 20 mg sq injections once weekly, and folic acid 2 mg po daily.  She denies any recent rheumatoid arthritis flares. She denies any joint pain or joint swelling at this time.  She denies any new concerns. She has received both covid-19 vaccinations.  She denies any recent infections.     Activities of Daily Living:  Patient reports morning stiffness for 0 minutes.   Patient Denies nocturnal pain.  Difficulty dressing/grooming: Denies Difficulty climbing stairs: Reports Difficulty getting out of chair: Reports Difficulty using hands for taps, buttons, cutlery, and/or writing: Denies  Review of Systems  Constitutional: Positive for fatigue.  HENT: Negative for mouth sores, mouth dryness and nose dryness.   Eyes: Negative for itching and dryness.  Respiratory: Negative for shortness of breath and difficulty breathing.   Cardiovascular: Negative for chest pain and palpitations.  Gastrointestinal: Negative for blood in stool, constipation and diarrhea.  Endocrine: Negative for increased urination.  Genitourinary: Negative for difficulty urinating.  Musculoskeletal: Positive for arthralgias and joint pain. Negative for joint swelling, myalgias, morning stiffness, muscle tenderness and myalgias.  Skin: Negative for color change, rash and redness.  Allergic/Immunologic: Negative for susceptible to infections.  Neurological: Negative for dizziness, numbness, headaches, memory loss and weakness.  Hematological: Negative for bruising/bleeding tendency.  Psychiatric/Behavioral:  Negative for confusion.    PMFS History:  Patient Active Problem List   Diagnosis Date Noted  . 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 High risk medication use 10/01/2016  . Vitamin D deficiency 10/01/2016  . Plantar fasciitis 10/01/2016    Past Medical History:  Diagnosis Date  . Arthritis   . Rheumatoid arthritis (HCC)     Family History  Problem Relation Age of Onset  . Ovarian cancer Mother   . COPD Father   . Depression Son    History reviewed. No pertinent surgical history. Social History   Social History Narrative  . Not on file   Immunization History  Administered Date(s) Administered  . Influenza-Unspecified 08/19/2014     Objective: Vital Signs: BP 118/85 (BP Location: Left Arm, Patient Position: Sitting, Cuff Size: Normal)   Pulse 85   Resp 14   Ht 5\' 1"  (1.549 m)   Wt 140 lb 9.6 oz (63.8 kg)   BMI 26.57 kg/m    Physical Exam Vitals and nursing note reviewed.  Constitutional:      Appearance: She is well-developed.  HENT:     Head: Normocephalic and atraumatic.  Eyes:     Conjunctiva/sclera: Conjunctivae normal.  Pulmonary:     Effort: Pulmonary effort is normal.  Abdominal:     General: Bowel sounds are normal.     Palpations: Abdomen is soft.  Musculoskeletal:     Cervical back: Normal range of motion.  Lymphadenopathy:     Cervical: No cervical adenopathy.  Skin:    General: Skin is warm and dry.     Capillary  Refill: Capillary refill takes less than 2 seconds.  Neurological:     Mental Status: She is alert and oriented to person, place, and time.  Psychiatric:        Behavior: Behavior normal.      Musculoskeletal Exam: C-spine, thoracic spine, and lumbar spine good ROM.  No midline spinal tenderness. Shoulder joints, elbow joints, wrist joints, MCPs, PIPs, and DIPs good ROM with no synovitis.  Complete fist formation bilaterally.  Hip joints, knee joints, ankle joints, MTPs good ROM with  no synovitis.  No warmth or effusion of knee joints.  No tenderness or swelling of ankle joints.    CDAI Exam: CDAI Score: 0  Patient Global: 0 mm; Provider Global: 0 mm Swollen: 0 ; Tender: 0  Joint Exam 05/23/2020   No joint exam has been documented for this visit   There is currently no information documented on the homunculus. Go to the Rheumatology activity and complete the homunculus joint exam.  Investigation: No additional findings.  Imaging: XR Foot 2 Views Left  Result Date: 05/23/2020 No MTP, PIP or DIP narrowing was noted.  No intertarsal, tibiotalar or subtalar joint space narrowing was noted.  No other changes were noted.  No interval change was noted from 2018. Impression: Unremarkable x-ray of the foot.  XR Foot 2 Views Right  Result Date: 05/23/2020 No MTP, PIP or DIP narrowing was noted.  No intertarsal, tibiotalar or subtalar joint space narrowing was noted.  No other changes were noted.  No interval change was noted from 2018. Impression: Unremarkable x-ray of the foot.  XR Hand 2 View Left  Result Date: 05/23/2020 Juxta-articular osteopenia was noted.  PIP narrowing was noted.  No MCP, intercarpal or radiocarpal joint space narrowing was noted.  No erosive changes were noted.  No interval change was noted when compared to the films of 2018. Impression: These findings are consistent with rheumatoid arthritis.  XR Hand 2 View Right  Result Date: 05/23/2020 Juxta-articular osteopenia was noted.  No MCP joint narrowing was noted.  PIP narrowing was noted.  Intercarpal and radiocarpal joint space narrowing was noted.  Erosive versus cystic changes were noted in the carpal bones.  No radiographic progression was noted when compared to the films of 2018. Impression: These findings are consistent with rheumatoid arthritis.   Recent Labs: Lab Results  Component Value Date   WBC 9.0 04/17/2020   HGB 14.1 04/17/2020   PLT 322 04/17/2020   NA 141 04/17/2020   K 5.0  04/17/2020   CL 104 04/17/2020   CO2 30 04/17/2020   GLUCOSE 94 04/17/2020   BUN 11 04/17/2020   CREATININE 0.79 04/17/2020   BILITOT 0.8 04/17/2020   ALKPHOS 59 03/23/2017   AST 15 04/17/2020   ALT 6 04/17/2020   PROT 7.0 04/17/2020   ALBUMIN 4.3 03/23/2017   CALCIUM 9.8 04/17/2020   GFRAA 102 04/17/2020   QFTBGOLD NEGATIVE 08/23/2017   QFTBGOLDPLUS NEGATIVE 12/21/2019    Speciality Comments: No specialty comments available.  Procedures:  No procedures performed Allergies: Patient has no known allergies.   Assessment / Plan:     Visit Diagnoses: Rheumatoid arthritis of multiple sites without rheumatoid factor (HCC) -  -RF, -CCP, -ANA, seronegative: She has no tenderness or synovitis on exam.  She has not had any recent rheumatoid arthritis flares.  She had a right knee joint effusion which was aspirated and injected with a cortisone on 12/21/2019 which resolved her discomfort.  She has good range of motion  of the right knee joint on exam today with no warmth or effusion.  She has not had any recurrence of pain or inflammation since the cortisone injection.  She is not having any joint pain or inflammation at this time.  She is clinically doing well on Humira 40 mg sq injections once every 14 days, Rasuvo 20 mg subcutaneous injections once weekly, and folic acid 2 mg by mouth daily.  X-rays of both hands and feet were obtained today which did not reveal any radiographic progression.  She will continue on the current treatment regimen.  She was advised to notify us if she develops increased joint pain or joint swelling.  She will follow-up in the office in 5 months.- Plan: XR Hand 2 View Left, XR Hand 2 View Right, XR Foot 2 Views Right, XR Foot 2 Views Left  High risk medication use - Humira 40 mg sq injections every 14 days, rasuvo 20 mg sq every 7 days, and folic acid 1 mg 2 tablets daily.  CBC and CMP WNL on 04/17/20.  She will be due to update lab work in September and every 3 months to  monitor for drug toxicity.  Standing orders are in place.   She has not had any recent infections.  She was advised to hold Humira and Rasuvo if she develops signs or symptoms of an infection and to resume once the infection has completely cleared.  She has received both COVID-19 vaccinations.  She is unsure if she held Humira and Rasuvo after receiving the vaccination.  We discussed the importance of continuing to wear her mask and social distance.  We also encouraged her to receive the booster vaccine once available.  She was advised to notify us or her PCP if she develops the COVID-19 infection in order to receive the Covid antibody infusion.  Plantar fasciitis - Resolved.   Effusion, right knee - Right knee joint aspiration and cortisone injection was performed on 12/21/2019. She is not having any discomfort or inflammation at this time.  She has good ROM of the right knee joint on exam.  No warmth or effusion noted on exam.  Other medical conditions are listed as follows:   Vitamin D deficiency  Arnold-Chiari malformation (HCC)  Right shoulder tendonitis - Resolved.   History of tubal ligation  Orders: Orders Placed This Encounter  Procedures  . XR Hand 2 View Left  . XR Hand 2 View Right  . XR Foot 2 Views Right  . XR Foot 2 Views Left   No orders of the defined types were placed in this encounter.  .  Follow-Up Instructions: Return in about 5 months (around 10/23/2020) for Rheumatoid arthritis.   Pollyann Savoy, MD   Scribed by-  Sherron Ales, PA-C  Note - This record has been created using Dragon software.  Chart creation errors have been sought, but may not always  have been located. Such creation errors do not reflect on  the standard of medical care.

## 2020-05-23 ENCOUNTER — Ambulatory Visit: Payer: Self-pay

## 2020-05-23 ENCOUNTER — Ambulatory Visit: Payer: PRIVATE HEALTH INSURANCE | Admitting: Rheumatology

## 2020-05-23 ENCOUNTER — Encounter: Payer: Self-pay | Admitting: Physician Assistant

## 2020-05-23 ENCOUNTER — Other Ambulatory Visit: Payer: Self-pay

## 2020-05-23 VITALS — BP 118/85 | HR 85 | Resp 14 | Ht 61.0 in | Wt 140.6 lb

## 2020-05-23 DIAGNOSIS — Z9851 Tubal ligation status: Secondary | ICD-10-CM

## 2020-05-23 DIAGNOSIS — M79671 Pain in right foot: Secondary | ICD-10-CM | POA: Diagnosis not present

## 2020-05-23 DIAGNOSIS — M0609 Rheumatoid arthritis without rheumatoid factor, multiple sites: Secondary | ICD-10-CM | POA: Diagnosis not present

## 2020-05-23 DIAGNOSIS — M79641 Pain in right hand: Secondary | ICD-10-CM

## 2020-05-23 DIAGNOSIS — Z79899 Other long term (current) drug therapy: Secondary | ICD-10-CM

## 2020-05-23 DIAGNOSIS — M722 Plantar fascial fibromatosis: Secondary | ICD-10-CM | POA: Diagnosis not present

## 2020-05-23 DIAGNOSIS — M79672 Pain in left foot: Secondary | ICD-10-CM

## 2020-05-23 DIAGNOSIS — M79642 Pain in left hand: Secondary | ICD-10-CM | POA: Diagnosis not present

## 2020-05-23 DIAGNOSIS — M25461 Effusion, right knee: Secondary | ICD-10-CM

## 2020-05-23 DIAGNOSIS — E559 Vitamin D deficiency, unspecified: Secondary | ICD-10-CM

## 2020-05-23 DIAGNOSIS — Q0700 Arnold-Chiari syndrome without spina bifida or hydrocephalus: Secondary | ICD-10-CM

## 2020-05-23 DIAGNOSIS — M778 Other enthesopathies, not elsewhere classified: Secondary | ICD-10-CM

## 2020-05-23 NOTE — Patient Instructions (Addendum)
Standing Labs °We placed an order today for your standing lab work.  ° °Please have your standing labs drawn in September and every 3 months  ° °If possible, please have your labs drawn 2 weeks prior to your appointment so that the provider can discuss your results at your appointment. ° °We have open lab daily °Monday through Thursday from 8:30-12:30 PM and 1:30-4:30 PM and Friday from 8:30-12:30 PM and 1:30-4:00 PM °at the office of Dr. Seena Face, [facility].   °Please be advised, patients with office appointments requiring lab work will take precedents over walk-in lab work.  °If possible, please come for your lab work on Monday and Friday afternoons, as you may experience shorter wait times. °The office is located at [address], [address], [place], [place] [address] °No appointment is necessary.   °Labs are drawn by [facility]. Please bring your co-pay at the time of your lab draw.  You may receive a bill from [facility] for your lab work. ° °If you wish to have your labs drawn at another location, please call the office 24 hours in advance to send orders. ° °If you have any questions regarding directions or hours of operation,  °please call [phone].   °As a reminder, please drink plenty of water prior to coming for your lab work. Thanks! ° ° °COVID-19 vaccine recommendations:  ° °COVID-19 vaccine is recommended for everyone (unless you are allergic to a vaccine component), even if you are on a medication that suppresses your immune system.  ° °If you are on Methotrexate, Cellcept (mycophenolate), Rinvoq, Xeljanz, and Olumiant- hold the medication for 1 week after each vaccine. Hold Methotrexate for 2 weeks after the single dose COVID-19 vaccine.  ° °If you are on Orencia subcutaneous injection - hold medication one week prior to and one week after the first COVID-19 vaccine dose (only).  ° °If you are on Orencia IV infusions- time vaccination administration so that the first COVID-19  vaccination will occur four weeks after the infusion and postpone the subsequent infusion by one week.  ° °If you are on Cyclophosphamide or Rituxan infusions please contact your doctor prior to receiving the COVID-19 vaccine.  ° °Do not take Tylenol or ant anti-inflammatory medications (NSAIDs) 24 hours prior to the COVID-19 vaccination.  ° °There is no direct evidence about the efficacy of the COVID-19 vaccine in individuals who are on medications that suppress the immune system.  ° °Even if you are fully vaccinated, and you are on any medications that suppress your immune system, please continue to wear a mask, maintain at least six feet social distance and practice hand hygiene.  ° °If you develop a COVID-19 infection, please contact your PCP or our office to determine if you need antibody infusion. ° °We anticipate that a booster vaccine will be available soon for immunosuppressed individuals. Please cal our office before receiving your booster dose to make adjustments to your medication regimen.  ° °

## 2020-07-18 ENCOUNTER — Other Ambulatory Visit: Payer: Self-pay | Admitting: Rheumatology

## 2020-07-18 ENCOUNTER — Telehealth: Payer: Self-pay

## 2020-07-18 NOTE — Telephone Encounter (Signed)
Last Visit: 05/23/2020 Next Visit: 10/24/2020 Labs: 04/17/2020 CBC and CMP normal. TB Gold: 12/21/2019 Neg   Current Dose per office note 05/23/2020: Humira 40 mg sq injections once every 14 days  DX: Rheumatoid arthritis of multiple sites without rheumatoid factor

## 2020-07-18 NOTE — Telephone Encounter (Signed)
Patient left a voicemail requesting prescription refill of Humira.  °

## 2020-07-18 NOTE — Telephone Encounter (Signed)
Prescription sent to the pharmacy. See. rx note for details.

## 2020-07-19 ENCOUNTER — Telehealth: Payer: Self-pay | Admitting: Rheumatology

## 2020-07-19 NOTE — Telephone Encounter (Signed)
Left message to advise patient her Humira prescription was sent to the pharmacy yesterday.

## 2020-07-19 NOTE — Telephone Encounter (Signed)
Patient requesting a refill on Humira sent to [facility].

## 2020-09-06 ENCOUNTER — Other Ambulatory Visit: Payer: Self-pay | Admitting: Rheumatology

## 2020-09-19 ENCOUNTER — Other Ambulatory Visit: Payer: Self-pay | Admitting: Rheumatology

## 2020-09-19 NOTE — Telephone Encounter (Signed)
Last Visit: 05/23/2020 Next Visit: 10/24/2020 Labs: 04/17/2020 CBC and CMP normal. TB Gold: 12/21/2019 Neg   Current Dose per office note 05/23/2020: Humira 40 mg sqinjections once every 14 days  DX: Rheumatoid arthritis of multiple sites without rheumatoid factor   Left message to advise patient she is due to update labs.   Okay to refill 30 day supply Humira?

## 2020-10-01 ENCOUNTER — Other Ambulatory Visit: Payer: Self-pay

## 2020-10-01 DIAGNOSIS — Z79899 Other long term (current) drug therapy: Secondary | ICD-10-CM

## 2020-10-02 LAB — COMPLETE METABOLIC PANEL WITH GFR
AG Ratio: 1.6 (calc) (ref 1.0–2.5)
ALT: 15 U/L (ref 6–29)
AST: 22 U/L (ref 10–35)
Albumin: 4.2 g/dL (ref 3.6–5.1)
Alkaline phosphatase (APISO): 67 U/L (ref 31–125)
BUN: 18 mg/dL (ref 7–25)
CO2: 29 mmol/L (ref 20–32)
Calcium: 9.7 mg/dL (ref 8.6–10.2)
Chloride: 102 mmol/L (ref 98–110)
Creat: 0.6 mg/dL (ref 0.50–1.10)
GFR, Est African American: 124 mL/min/{1.73_m2} (ref >=60)
GFR, Est Non African American: 107 mL/min/{1.73_m2} (ref >=60)
Globulin: 2.6 g/dL (calc) (ref 1.9–3.7)
Glucose, Bld: 107 mg/dL — ABNORMAL HIGH (ref 65–99)
Potassium: 4.5 mmol/L (ref 3.5–5.3)
Sodium: 140 mmol/L (ref 135–146)
Total Bilirubin: 0.8 mg/dL (ref 0.2–1.2)
Total Protein: 6.8 g/dL (ref 6.1–8.1)

## 2020-10-02 LAB — CBC WITH DIFFERENTIAL/PLATELET
Absolute Monocytes: 683 cells/uL (ref 200–950)
Basophils Absolute: 82 cells/uL (ref 0–200)
Basophils Relative: 0.9 %
Eosinophils Absolute: 419 cells/uL (ref 15–500)
Eosinophils Relative: 4.6 %
HCT: 42 % (ref 35.0–45.0)
Hemoglobin: 14.3 g/dL (ref 11.7–15.5)
Lymphs Abs: 2038 cells/uL (ref 850–3900)
MCH: 29.7 pg (ref 27.0–33.0)
MCHC: 34 g/dL (ref 32.0–36.0)
MCV: 87.3 fL (ref 80.0–100.0)
MPV: 11.4 fL (ref 7.5–12.5)
Monocytes Relative: 7.5 %
Neutro Abs: 5879 cells/uL (ref 1500–7800)
Neutrophils Relative %: 64.6 %
Platelets: 294 10*3/uL (ref 140–400)
RBC: 4.81 10*6/uL (ref 3.80–5.10)
RDW: 11.7 % (ref 11.0–15.0)
Total Lymphocyte: 22.4 %
WBC: 9.1 10*3/uL (ref 3.8–10.8)

## 2020-10-10 NOTE — Progress Notes (Signed)
Office Visit Note  Patient: Sheena Rivera             Date of Birth: 1971/02/26           MRN: [id number]             PCP: Kerin Salen, PA-C Referring: No ref. provider found Visit Date: 10/24/2020 Occupation: @GUAROCC @  Subjective:  Pain in both knees   History of Present Illness: Sheena Rivera is a 49 y.o. female with history of seronegative rheumatoid arthritis.  Patient is on Humira 40 mg sq injections every 14 days, Rasuvo 20 mg sq injections once weekly, and folic acid 2 mg by mouth daily.  She has not missed any doses of Humira or rasuvo recently.  She is due for rasuvo dose tomorrow.  Patient reports that yesterday she was taking down Christmas decorations at work and was walking and lifting a lot more than normal.  She states that she woke up this morning with pain and swelling in both knee joints.  She denied any nocturnal pain last night.  She took 2 Advil's this morning which have alleviated some of her discomfort.  She denies any other joint pain or joint swelling at this time.  She states that her last flare was about 4 months ago.  She has been doing really well on the current combination of medications and does not want to make any medication changes at this time. She denies any recent infections.  She is planning on receiving the COVID-19 booster.  Activities of Daily Living:  Patient reports morning stiffness for 10 minutes.   Patient Denies nocturnal pain.  Difficulty dressing/grooming: Denies Difficulty climbing stairs: Denies Difficulty getting out of chair: Denies Difficulty using hands for taps, buttons, cutlery, and/or writing: Denies  Review of Systems  Constitutional: Positive for fatigue.  HENT: Positive for mouth dryness.   Eyes: Positive for dryness.  Respiratory: Negative for shortness of breath.   Cardiovascular: Negative for swelling in legs/feet.  Gastrointestinal: Negative for constipation.  Endocrine: Negative for excessive thirst.   Genitourinary: Negative for difficulty urinating.  Musculoskeletal: Positive for joint swelling and morning stiffness.  Skin: Negative for rash.  Allergic/Immunologic: Negative for susceptible to infections.  Neurological: Negative for numbness.  Hematological: Negative for bruising/bleeding tendency.  Psychiatric/Behavioral: Negative for sleep disturbance.    PMFS History:  Patient Active Problem List   Diagnosis Date Noted  . 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 High risk medication use 10/01/2016  . Vitamin D deficiency 10/01/2016  . Plantar fasciitis 10/01/2016    Past Medical History:  Diagnosis Date  . Arthritis   . Rheumatoid arthritis (HCC)     Family History  Problem Relation Age of Onset  . Ovarian cancer Mother   . COPD Father   . Depression Son    History reviewed. No pertinent surgical history. Social History   Social History Narrative  . Not on file   Immunization History  Administered Date(s) Administered  . Influenza-Unspecified 08/19/2014  . Moderna Sars-Covid-2 Vaccination 11/14/2019, 12/12/2019     Objective: Vital Signs: BP 120/80 (BP Location: Left Arm, Patient Position: Sitting, Cuff Size: Normal)   Pulse 81   Resp 14   Ht 5\' 1"  (1.549 m)   Wt 134 lb 3.2 oz (60.9 kg)   BMI 25.36 kg/m    Physical Exam Vitals and nursing note reviewed.  Constitutional:      Appearance: She is well-developed and well-nourished.  HENT:  Head: Normocephalic and atraumatic.  Eyes:     Extraocular Movements: EOM normal.     Conjunctiva/sclera: Conjunctivae normal.  Cardiovascular:     Pulses: Intact distal pulses.  Pulmonary:     Effort: Pulmonary effort is normal.  Abdominal:     Palpations: Abdomen is soft.  Musculoskeletal:     Cervical back: Normal range of motion.  Skin:    General: Skin is warm and dry.     Capillary Refill: Capillary refill takes less than 2 seconds.  Neurological:      Mental Status: She is alert and oriented to person, place, and time.  Psychiatric:        Mood and Affect: Mood and affect normal.        Behavior: Behavior normal.      Musculoskeletal Exam: C-spine, thoracic spine, lumbar spine have good range of motion with no discomfort.  Shoulder joints, elbow joints, wrist joints, MCPs, PIPs, and DIPs have good range of motion with no synovitis.  She is able to make a complete fist bilaterally.  Hip joints have good range of motion with no discomfort at this time.  Painful and limited flexion extension of both knees noted.  Warmth and effusion in bilateral knees noted.  Ankle joints have good range of motion with no tenderness or inflammation.  CDAI Exam: CDAI Score: 4.8  Patient Global: 4 mm; Provider Global: 4 mm Swollen: 2 ; Tender: 2  Joint Exam 10/24/2020      Right  Left  Knee  Swollen Tender  Swollen Tender     Investigation: No additional findings.  Imaging: No results found.  Recent Labs: Lab Results  Component Value Date   WBC 9.1 10/01/2020   HGB 14.3 10/01/2020   PLT 294 10/01/2020   NA 140 10/01/2020   K 4.5 10/01/2020   CL 102 10/01/2020   CO2 29 10/01/2020   GLUCOSE 107 (H) 10/01/2020   BUN 18 10/01/2020   CREATININE 0.60 10/01/2020   BILITOT 0.8 10/01/2020   ALKPHOS 59 03/23/2017   AST 22 10/01/2020   ALT 15 10/01/2020   PROT 6.8 10/01/2020   ALBUMIN 4.3 03/23/2017   CALCIUM 9.7 10/01/2020   GFRAA 124 10/01/2020   QFTBGOLD NEGATIVE 08/23/2017   QFTBGOLDPLUS NEGATIVE 12/21/2019    Speciality Comments: No specialty comments available.  Procedures:  No procedures performed Allergies: Patient has no known allergies.   Assessment / Plan:     Visit Diagnoses: Rheumatoid arthritis of multiple sites without rheumatoid factor (HCC) - -RF, -CCP, -ANA, seronegative: She presents today with bilateral knee joint effusions.  She has painful limited flexion and extension of both knees on examination today  According to  the patient she was taking down Christmas decorations at work yesterday and feels that she may have overexerted herself.  She woke up this morning with pain and swelling in both knees.  She has not experiencing any other joint pain or inflammation at this time.  Her last flare was about 4 months ago.  Overall she has clinically been doing well on Humira 40 mg sq injections every 14 days, Rasuvo 20 mg sq injections once weekly, and folic acid 2 mg by mouth daily.  She feels that the combination therapy remains effective and attributes this flare to performing strenuous activities yesterday at work.  She does not want to make any medication changes at this time.  A prednisone taper starting at 20 mg tapering by 5 mg every week was sent to the  pharmacy.  She was advised to notify us if her knee joint pain and inflammation persists or worsens.  She will follow-up in the office in 3 months to reassess.  High risk medication use - Humira 40 mg sq injections every 14 days, rasuvo 20 mg sq every 7 days, and folic acid 1 mg 2 tablets daily.  CBC and CMP were within normal limits on 10/01/2020.  She will be due to update lab work in March and every 3 months to monitor for drug toxicity.  Standing orders for CBC and CMP were placed today.  TB gold negative on 12/21/2019.  Future order for TB gold was placed today.- Plan: QuantiFERON-TB Gold Plus, CBC with Differential/Platelet, COMPLETE METABOLIC PANEL WITH GFR She has not had any recent infections.  She has received both COVID-19 vaccinations and plans on receiving the booster dose.  We discussed the importance of avoiding Tylenol and NSAIDs 24 hours prior to the booster as well as holding Rasuvo for 1 week after the booster.  She plans on receiving the booster after finishing a prednisone taper as recommended.  We discussed the importance of holding rasuvo and Humira if she develops signs or symptoms of an infection and to resume once the infection has completely  cleared. We discussed the importance of yearly skin exams while on Humira.  Plantar fasciitis - Resolved.   Bilateral knee effusions- She presents today with bilateral knee joint effusions.  She has painful limited range of motion with flexion and extension.  Warmth was noted.  According to the patient she woke up this morning with bilateral knee joint pain and swelling after taking Christmas decorations down at work yesterday.  She was walking and lifting a lot more than normal which she feels caused a flare.  She took 2 Advil this morning which has alleviated some of her discomfort.  A prednisone taper starting at 20 mg tapering by 5 mg every week was sent to the pharmacy.  She was advised to notify us if she has any recurrence in her symptoms.  She will follow-up in the office in 3 months and we will reassess her clinical status on combination therapy. If she has recurrence of symptoms we will discuss other treatment options at that time.  Right shoulder tendonitis - Resolved.  She has good range of motion of the right shoulder joint on examination today.  No tenderness or inflammation was noted.  Vitamin D deficiency: She has been taking a vitamin D supplement as recommended.  Screening for tuberculosis -TB gold negative on 12/21/19.  Future order for TB gold was placed today.  Plan: QuantiFERON-TB Gold Plus  Other medical conditions are listed as follows:   Arnold-Chiari malformation (HCC)  History of tubal ligation    Orders: Orders Placed This Encounter  Procedures  . QuantiFERON-TB Gold Plus  . CBC with Differential/Platelet  . COMPLETE METABOLIC PANEL WITH GFR   Meds ordered this encounter  Medications  . predniSONE (DELTASONE) 5 MG tablet    Sig: Take 4 tablets by mouth daily x1 wk, 3 tablets by mouth daily x1wk, 2 tablets by mouth daily x1wk, 1 tablet by mouth daily x1 wk.    Dispense:  70 tablet    Refill:  0     Follow-Up Instructions: Return in about 3 months (around  01/22/2021) for Rheumatoid arthritis.   Gearldine Bienenstock, PA-C  Note - This record has been created using Dragon software.  Chart creation errors have been sought, but may not  always  have been located. Such creation errors do not reflect on  the standard of medical care.

## 2020-10-23 ENCOUNTER — Other Ambulatory Visit: Payer: Self-pay | Admitting: Rheumatology

## 2020-10-23 NOTE — Telephone Encounter (Signed)
Last Visit: 05/23/2020 Next Visit: 10/24/2020 Labs: 10/01/2020, Glucose is 107. Rest of CMP WNL. CBC WNL.  TB Gold: 12/21/2019, negative  Current Dose per office note 05/23/2020: Humira 40 mg sq injections once every 14 days  VV:[id number] arthritis of multiple sites without rheumatoid factor  Okay to refill Humira?

## 2020-10-24 ENCOUNTER — Other Ambulatory Visit: Payer: Self-pay

## 2020-10-24 ENCOUNTER — Encounter: Payer: Self-pay | Admitting: Physician Assistant

## 2020-10-24 ENCOUNTER — Ambulatory Visit: Payer: PRIVATE HEALTH INSURANCE | Admitting: Physician Assistant

## 2020-10-24 VITALS — BP 120/80 | HR 81 | Resp 14 | Ht 61.0 in | Wt 134.2 lb

## 2020-10-24 DIAGNOSIS — Z79899 Other long term (current) drug therapy: Secondary | ICD-10-CM | POA: Diagnosis not present

## 2020-10-24 DIAGNOSIS — Q0700 Arnold-Chiari syndrome without spina bifida or hydrocephalus: Secondary | ICD-10-CM

## 2020-10-24 DIAGNOSIS — M25461 Effusion, right knee: Secondary | ICD-10-CM

## 2020-10-24 DIAGNOSIS — Z9851 Tubal ligation status: Secondary | ICD-10-CM

## 2020-10-24 DIAGNOSIS — M0609 Rheumatoid arthritis without rheumatoid factor, multiple sites: Secondary | ICD-10-CM

## 2020-10-24 DIAGNOSIS — M722 Plantar fascial fibromatosis: Secondary | ICD-10-CM

## 2020-10-24 DIAGNOSIS — E559 Vitamin D deficiency, unspecified: Secondary | ICD-10-CM

## 2020-10-24 DIAGNOSIS — M778 Other enthesopathies, not elsewhere classified: Secondary | ICD-10-CM

## 2020-10-24 DIAGNOSIS — Z111 Encounter for screening for respiratory tuberculosis: Secondary | ICD-10-CM

## 2020-10-24 DIAGNOSIS — M25462 Effusion, left knee: Secondary | ICD-10-CM

## 2020-10-24 MED ORDER — PREDNISONE 5 MG PO TABS: ORAL_TABLET | ORAL | 0 refills | Status: DC

## 2020-10-24 NOTE — Patient Instructions (Signed)
Standing Labs We placed an order today for your standing lab work.   Please have your standing labs drawn in March and every 3 months    If possible, please have your labs drawn 2 weeks prior to your appointment so that the provider can discuss your results at your appointment.  We have open lab daily Monday through Thursday from 8:30-12:30 PM and 1:30-4:30 PM and Friday from 8:30-12:30 PM and 1:30-4:00 PM at the office of Dr. Shaili Deveshwar, [facility].   Please be advised, patients with office appointments requiring lab work will take precedents over walk-in lab work.  If possible, please come for your lab work on Monday and Friday afternoons, as you may experience shorter wait times. The office is located at [address], [address], [place],  [address] No appointment is necessary.   Labs are drawn by [facility]. Please bring your co-pay at the time of your lab draw.  You may receive a bill from [facility] for your lab work.  If you wish to have your labs drawn at another location, please call the office 24 hours in advance to send orders.  If you have any questions regarding directions or hours of operation,  please call [phone].   As a reminder, please drink plenty of water prior to coming for your lab work. Thanks!   

## 2021-01-24 NOTE — Progress Notes (Signed)
Office Visit Note  Patient: Sheena Rivera             Date of Birth: 1970/10/21           MRN: [id number]             PCP: Kerin Salen, PA-C Referring: Kathaleen Bury* Visit Date: 02/06/2021 Occupation: @GUAROCC @  Subjective:  Pain in both knees.   History of Present Illness: Sheena Rivera is a 50 y.o. female with a history of seronegative rheumatoid arthritis.  She states she had a flare of rheumatoid arthritis in January with pain in bilateral knee joints.  She responded to prednisone well.  She has not had a flare since then.  She states this morning her knees are stiff.  In the last year she has had only 2 flares.  She has been tolerating Humira and methotrexate well.  She has not had any recurrence of plantar fasciitis or shoulder joint pain.  Activities of Daily Living:  Patient reports morning stiffness for 0 minutes.   Patient Denies nocturnal pain.  Difficulty dressing/grooming: Denies Difficulty climbing stairs: Denies Difficulty getting out of chair: Denies Difficulty using hands for taps, buttons, cutlery, and/or writing: Reports  Review of Systems  Constitutional: Positive for fatigue. Negative for night sweats, weight gain and weight loss.  HENT: Negative for mouth sores, trouble swallowing, trouble swallowing, mouth dryness and nose dryness.   Eyes: Negative for pain, redness, itching, visual disturbance and dryness.  Respiratory: Negative for cough, shortness of breath and difficulty breathing.   Cardiovascular: Negative for chest pain, palpitations, hypertension, irregular heartbeat and swelling in legs/feet.  Gastrointestinal: Negative for blood in stool, constipation and diarrhea.  Endocrine: Negative for increased urination.  Genitourinary: Negative for difficulty urinating and vaginal dryness.  Musculoskeletal: Positive for arthralgias and joint pain. Negative for joint swelling, myalgias, muscle weakness, morning stiffness, muscle  tenderness and myalgias.  Skin: Negative for color change, rash, hair loss, redness, skin tightness, ulcers and sensitivity to sunlight.  Allergic/Immunologic: Negative for susceptible to infections.  Neurological: Negative for dizziness, numbness, headaches, memory loss, night sweats and weakness.  Hematological: Negative for bruising/bleeding tendency and swollen glands.  Psychiatric/Behavioral: Negative for depressed mood, confusion and sleep disturbance. The patient is not nervous/anxious.     PMFS History:  Patient Active Problem List   Diagnosis Date Noted  . 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 High risk medication use 10/01/2016  . Vitamin D deficiency 10/01/2016  . Plantar fasciitis 10/01/2016    Past Medical History:  Diagnosis Date  . Arthritis   . Rheumatoid arthritis (HCC)     Family History  Problem Relation Age of Onset  . Ovarian cancer Mother   . COPD Father   . Depression Son    History reviewed. No pertinent surgical history. Social History   Social History Narrative  . Not on file   Immunization History  Administered Date(s) Administered  . Influenza-Unspecified 08/19/2014  . Moderna Sars-Covid-2 Vaccination 11/14/2019, 12/12/2019     Objective: Vital Signs: BP 123/81 (BP Location: Left Arm, Patient Position: Sitting, Cuff Size: Normal)   Pulse 77   Ht 5\' 1"  (1.549 m)   Wt 135 lb (61.2 kg)   BMI 25.51 kg/m    Physical Exam Vitals and nursing note reviewed.  Constitutional:      Appearance: She is well-developed.  HENT:     Head: Normocephalic and atraumatic.  Eyes:     Conjunctiva/sclera: Conjunctivae normal.  Cardiovascular:     Rate and Rhythm: Normal rate and regular rhythm.     Heart sounds: Normal heart sounds.  Pulmonary:     Effort: Pulmonary effort is normal.     Breath sounds: Normal breath sounds.  Abdominal:     General: Bowel sounds are normal.     Palpations: Abdomen is  soft.  Musculoskeletal:     Cervical back: Normal range of motion.  Lymphadenopathy:     Cervical: No cervical adenopathy.  Skin:    General: Skin is warm and dry.     Capillary Refill: Capillary refill takes less than 2 seconds.  Neurological:     Mental Status: She is alert and oriented to person, place, and time.  Psychiatric:        Behavior: Behavior normal.      Musculoskeletal Exam: C-spine thoracic and lumbar spine with good range of motion.  Shoulder joints, elbow joints, wrist joints, MCPs PIPs and DIPs with good range of motion with no synovitis.  She had good range of motion of bilateral hip joints and knee joints.  She had warmth on palpation of her bilateral knee joints with no effusion.  There is no tenderness over ankles or MTPs.  CDAI Exam: CDAI Score: 2.6  Patient Global: 2 mm; Provider Global: 4 mm Swollen: 2 ; Tender: 0  Joint Exam 02/06/2021      Right  Left  Knee  Swollen   Swollen      Investigation: No additional findings.  Imaging: No results found.  Recent Labs: Lab Results  Component Value Date   WBC 9.1 10/01/2020   HGB 14.3 10/01/2020   PLT 294 10/01/2020   NA 140 10/01/2020   K 4.5 10/01/2020   CL 102 10/01/2020   CO2 29 10/01/2020   GLUCOSE 107 (H) 10/01/2020   BUN 18 10/01/2020   CREATININE 0.60 10/01/2020   BILITOT 0.8 10/01/2020   ALKPHOS 59 03/23/2017   AST 22 10/01/2020   ALT 15 10/01/2020   PROT 6.8 10/01/2020   ALBUMIN 4.3 03/23/2017   CALCIUM 9.7 10/01/2020   GFRAA 124 10/01/2020   QFTBGOLD NEGATIVE 08/23/2017   QFTBGOLDPLUS NEGATIVE 12/21/2019    Speciality Comments: No specialty comments available.  Procedures:  No procedures performed Allergies: Patient has no known allergies.   Assessment / Plan:     Visit Diagnoses: Rheumatoid arthritis of multiple sites without rheumatoid factor (HCC) -  -RF, -CCP, -ANA, seronegative: Patient states that she is doing well on the combination of Humira and Rasuvo.  She had  some warmth on palpation of bilateral knee joints.  She states she did really well after the prednisone taper in January and this morning she woke up with some pain and stiffness.  She states in the last 1 year she has had only 2 flares.  She does not want to change her therapy.  We had detailed discussion regarding different treatment options even switching between anti-TNF's.  At this point she would like to think about it.  I told her to keep a calendar about the number of flare she experienced until the next visit and then we can make a decision at the follow-up visit.- Plan: Sedimentation rate  High risk medication use - Humira 40 mg sq injections every 14 days, rasuvo 20 mg sq every 7 days, and folic acid 1 mg 2 tablets daily.Her last labs from October 01, 2020 were within normal limits.  TB gold was negative on December 21, 2019.  We will check labs today.  The labs will be monitored every 3 months to monitor for drug toxicity.- Plan: CBC with Differential/Platelet, COMPLETE METABOLIC PANEL WITH GFR, QuantiFERON-TB Gold Plus  Chronic pain of both knees-she complains of some stiffness in her knee joints.  She has warmth on palpation of bilateral knee joints but no effusion was noted.  She stated the symptoms started this morning and usually will resolve.  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  Vitamin D deficiency-she has been taking vitamin D supplement.  Her last vitamin D level in 2018 was 49.  I encouraged her to take vitamin D on a regular basis.  Arnold-Chiari malformation (HCC)  History of tubal ligation  Educated about COVID-19 virus infection-patient is fully vaccinated against COVID-19.  She plans to get a booster soon after work.  Pneumococcal and Shingrix vaccine was also advised.  The information was placed in the AVS.  Increased risk of heart disease with rheumatoid arthritis was discussed.  Her BMI is 25.51.  Handout was placed in the AVS regarding  exercise and dietary modifications.  She walks for exercise.  Orders: Orders Placed This Encounter  Procedures  . CBC with Differential/Platelet  . COMPLETE METABOLIC PANEL WITH GFR  . QuantiFERON-TB Gold Plus  . Sedimentation rate   No orders of the defined types were placed in this encounter.    Follow-Up Instructions: Return in about 5 months (around 07/09/2021) for Rheumatoid arthritis.   Pollyann Savoy, MD  Note - This record has been created using Animal nutritionist.  Chart creation errors have been sought, but may not always  have been located. Such creation errors do not reflect on  the standard of medical care.

## 2021-02-06 ENCOUNTER — Encounter: Payer: Self-pay | Admitting: Rheumatology

## 2021-02-06 ENCOUNTER — Ambulatory Visit: Payer: PRIVATE HEALTH INSURANCE | Admitting: Rheumatology

## 2021-02-06 ENCOUNTER — Other Ambulatory Visit: Payer: Self-pay

## 2021-02-06 VITALS — BP 123/81 | HR 77 | Ht 61.0 in | Wt 135.0 lb

## 2021-02-06 DIAGNOSIS — M25562 Pain in left knee: Secondary | ICD-10-CM

## 2021-02-06 DIAGNOSIS — Z9851 Tubal ligation status: Secondary | ICD-10-CM

## 2021-02-06 DIAGNOSIS — M25461 Effusion, right knee: Secondary | ICD-10-CM

## 2021-02-06 DIAGNOSIS — Z7189 Other specified counseling: Secondary | ICD-10-CM

## 2021-02-06 DIAGNOSIS — M0609 Rheumatoid arthritis without rheumatoid factor, multiple sites: Secondary | ICD-10-CM

## 2021-02-06 DIAGNOSIS — E559 Vitamin D deficiency, unspecified: Secondary | ICD-10-CM

## 2021-02-06 DIAGNOSIS — M778 Other enthesopathies, not elsewhere classified: Secondary | ICD-10-CM

## 2021-02-06 DIAGNOSIS — Z79899 Other long term (current) drug therapy: Secondary | ICD-10-CM

## 2021-02-06 DIAGNOSIS — G8929 Other chronic pain: Secondary | ICD-10-CM

## 2021-02-06 DIAGNOSIS — M722 Plantar fascial fibromatosis: Secondary | ICD-10-CM

## 2021-02-06 DIAGNOSIS — M25561 Pain in right knee: Secondary | ICD-10-CM

## 2021-02-06 DIAGNOSIS — Q0700 Arnold-Chiari syndrome without spina bifida or hydrocephalus: Secondary | ICD-10-CM

## 2021-02-06 NOTE — Patient Instructions (Signed)
Standing Labs We placed an order today for your standing lab work.   Please have your standing labs drawn in July and every 3 months  If possible, please have your labs drawn 2 weeks prior to your appointment so that the provider can discuss your results at your appointment.  We have open lab daily Monday through Thursday from 1:30-4:30 PM and Friday from 1:30-4:00 PM at the office of Dr. Kensington Rios, [facility].   Please be advised, all patients with office appointments requiring lab work will take precedents over walk-in lab work.  If possible, please come for your lab work on Monday and Friday afternoons, as you may experience shorter wait times. The office is located at [address], [address], [place], [place] [address] No appointment is necessary.   Labs are drawn by [facility]. Please bring your co-pay at the time of your lab draw.  You may receive a bill from [facility] for your lab work.  If you wish to have your labs drawn at another location, please call the office 24 hours in advance to send orders.  If you have any questions regarding directions or hours of operation,  please call [phone].   As a reminder, please drink plenty of water prior to coming for your lab work. Thanks!  Vaccines You are taking a medication(s) that can suppress your immune system.  The following immunizations are recommended: . Flu annually . Covid-19  . Pneumonia (Pneumovax 23 and Prevnar 13 spaced at least 1 year apart) . Shingrix (after age 50)  Please check with your PCP to make sure you are up to date.  Heart Disease Prevention   Your inflammatory disease increases your risk of heart disease which includes heart attack, stroke, atrial fibrillation (irregular heartbeats), high blood pressure, heart failure and atherosclerosis (plaque in the arteries).  It is important to reduce your risk by:   . Keep blood pressure, cholesterol, and blood sugar at healthy levels   . Smoking  Cessation   . Maintain a healthy weight  o BMI 20-25   . Eat a healthy diet  o Plenty of fresh fruit, vegetables, and whole grains  o Limit saturated fats, foods high in sodium, and added sugars  o DASH and Mediterranean diet   . Increase physical activity  o Recommend moderate physically activity for 150 minutes per week/ 30 minutes a day for five days a week These can be broken up into three separate ten-minute sessions during the day.   . Reduce Stress  . Meditation, slow breathing exercises, yoga, coloring books  . Dental visits twice a year   

## 2021-02-08 LAB — QUANTIFERON-TB GOLD PLUS
Mitogen-NIL: >10 IU/mL
NIL: 0.02 IU/mL
QuantiFERON-TB Gold Plus: NEGATIVE
TB1-NIL: 0 IU/mL
TB2-NIL: 0 IU/mL

## 2021-02-08 LAB — COMPLETE METABOLIC PANEL WITH GFR
AG Ratio: 1.7 (calc) (ref 1.0–2.5)
ALT: 11 U/L (ref 6–29)
AST: 16 U/L (ref 10–35)
Albumin: 4.6 g/dL (ref 3.6–5.1)
Alkaline phosphatase (APISO): 77 U/L (ref 37–153)
BUN: 16 mg/dL (ref 7–25)
CO2: 29 mmol/L (ref 20–32)
Calcium: 9.6 mg/dL (ref 8.6–10.4)
Chloride: 104 mmol/L (ref 98–110)
Creat: 0.65 mg/dL (ref 0.50–1.05)
GFR, Est African American: 120 mL/min/{1.73_m2} (ref >=60)
GFR, Est Non African American: 104 mL/min/{1.73_m2} (ref >=60)
Globulin: 2.7 g/dL (calc) (ref 1.9–3.7)
Glucose, Bld: 94 mg/dL (ref 65–99)
Potassium: 4.6 mmol/L (ref 3.5–5.3)
Sodium: 141 mmol/L (ref 135–146)
Total Bilirubin: 0.8 mg/dL (ref 0.2–1.2)
Total Protein: 7.3 g/dL (ref 6.1–8.1)

## 2021-02-08 LAB — CBC WITH DIFFERENTIAL/PLATELET
Absolute Monocytes: 437 cells/uL (ref 200–950)
Basophils Absolute: 62 cells/uL (ref 0–200)
Basophils Relative: 0.8 %
Eosinophils Absolute: 156 cells/uL (ref 15–500)
Eosinophils Relative: 2 %
HCT: 45.9 % — ABNORMAL HIGH (ref 35.0–45.0)
Hemoglobin: 15.4 g/dL (ref 11.7–15.5)
Lymphs Abs: 1778 cells/uL (ref 850–3900)
MCH: 29.8 pg (ref 27.0–33.0)
MCHC: 33.6 g/dL (ref 32.0–36.0)
MCV: 89 fL (ref 80.0–100.0)
MPV: 10.9 fL (ref 7.5–12.5)
Monocytes Relative: 5.6 %
Neutro Abs: 5366 cells/uL (ref 1500–7800)
Neutrophils Relative %: 68.8 %
Platelets: 329 10*3/uL (ref 140–400)
RBC: 5.16 10*6/uL — ABNORMAL HIGH (ref 3.80–5.10)
RDW: 11.8 % (ref 11.0–15.0)
Total Lymphocyte: 22.8 %
WBC: 7.8 10*3/uL (ref 3.8–10.8)

## 2021-02-08 LAB — SEDIMENTATION RATE: Sed Rate: 11 mm/h (ref 0–20)

## 2021-02-09 NOTE — Progress Notes (Signed)
TB Gold is negative.

## 2021-06-26 NOTE — Progress Notes (Addendum)
Office Visit Note  Patient: Sheena Rivera             Date of Birth: [birth year]           MRN: [id number]             PCP: Kerin Salen, PA-C Referring: Kathaleen Bury* Visit Date: 07/10/2021 Occupation: @GUAROCC @  Subjective:  Pain and swelling in both knees  History of Present Illness: Sheena Rivera is a 50 y.o. female with history of seronegative rheumatoid arthritis.  Patient is currently on Humira 40 mg subcutaneous injections every 14 days, Rasuvo 20 mg subcutaneous injections once weekly, folic acid 2 mg by mouth daily.  She has not missed any doses of Humira or Rasuvo recently.  Patient reports that she continues to have 1-2 flares per month.  She experiences recurrent pain and swelling in both knee joints.  She takes ibuprofen as needed for symptomatic relief.  She denies any other joint pain or joint swelling at this time. She denies any recent infections.  She is planning on receiving annual influenza vaccination.    Activities of Daily Living:  Patient reports morning stiffness for 0 minutes  Patient Denies nocturnal pain.  Difficulty dressing/grooming: Denies Difficulty climbing stairs: Denies Difficulty getting out of chair: Denies Difficulty using hands for taps, buttons, cutlery, and/or writing: Denies  Review of Systems  Constitutional:  Positive for fatigue.  HENT:  Positive for mouth dryness. Negative for mouth sores and nose dryness.   Eyes:  Positive for dryness. Negative for pain and visual disturbance.  Respiratory:  Negative for cough, hemoptysis, shortness of breath and difficulty breathing.   Cardiovascular:  Negative for chest pain, palpitations, hypertension and swelling in legs/feet.  Gastrointestinal:  Negative for blood in stool, constipation and diarrhea.  Endocrine: Negative for increased urination.  Genitourinary:  Negative for difficulty urinating and painful urination.  Musculoskeletal:  Positive for joint pain, joint pain  and joint swelling. Negative for myalgias, muscle weakness, morning stiffness, muscle tenderness and myalgias.  Skin:  Negative for color change, pallor, rash, hair loss, nodules/bumps, skin tightness, ulcers and sensitivity to sunlight.  Allergic/Immunologic: Negative for susceptible to infections.  Neurological:  Negative for dizziness, numbness, headaches and weakness.  Hematological:  Negative for bruising/bleeding tendency and swollen glands.  Psychiatric/Behavioral:  Negative for depressed mood and sleep disturbance. The patient is not nervous/anxious.    PMFS History:  Patient Active Problem List   Diagnosis Date Noted   Rheumatoid arthritis of multiple sites without rheumatoid factor (HCC) 10/01/2016   High risk medication use 10/01/2016   Vitamin D deficiency 10/01/2016   Plantar fasciitis 10/01/2016    Past Medical History:  Diagnosis Date   Arthritis    Rheumatoid arthritis (HCC)     Family History  Problem Relation Age of Onset   Ovarian cancer Mother    COPD Father    Depression Son    History reviewed. No pertinent surgical history. Social History   Social History Narrative   Not on file   Immunization History  Administered Date(s) Administered   Influenza-Unspecified 08/19/2014   Moderna Sars-Covid-2 Vaccination 11/14/2019, 12/12/2019     Objective: Vital Signs: BP 117/73 (BP Location: Left Arm, Patient Position: Sitting, Cuff Size: Normal)   Pulse 86   Resp 14   Ht 5\' 1"  (1.549 m)   Wt 140 lb (63.5 kg)   BMI 26.45 kg/m    Physical Exam Vitals and nursing note reviewed.  Constitutional:  Appearance: She is well-developed.  HENT:     Head: Normocephalic and atraumatic.  Eyes:     Conjunctiva/sclera: Conjunctivae normal.  Pulmonary:     Effort: Pulmonary effort is normal.  Abdominal:     Palpations: Abdomen is soft.  Musculoskeletal:     Cervical back: Normal range of motion.  Skin:    General: Skin is warm and dry.     Capillary Refill:  Capillary refill takes less than 2 seconds.  Neurological:     Mental Status: She is alert and oriented to person, place, and time.  Psychiatric:        Behavior: Behavior normal.     Musculoskeletal Exam: C-spine, thoracic spine, lumbar spine good range of motion with no discomfort.  Shoulder joints, elbow joints, wrist joints, MCPs, PIPs, DIPs have good range of motion with no synovitis.  She was able to make a complete fist bilaterally.  Hip joints have good range of motion with no discomfort.  Bilateral knee joint effusions noted, right greater than left.  Ankle joints have good range of motion with no tenderness or joint swelling.  CDAI Exam: CDAI Score: 5  Patient Global: 5 mm; Provider Global: 5 mm Swollen: 2 ; Tender: 2  Joint Exam 07/10/2021      Right  Left  Knee  Swollen Tender  Swollen Tender     Investigation: No additional findings.  Imaging: No results found.  Recent Labs: Lab Results  Component Value Date   WBC 7.8 02/06/2021   HGB 15.4 02/06/2021   PLT 329 02/06/2021   NA 141 02/06/2021   K 4.6 02/06/2021   CL 104 02/06/2021   CO2 29 02/06/2021   GLUCOSE 94 02/06/2021   BUN 16 02/06/2021   CREATININE 0.65 02/06/2021   BILITOT 0.8 02/06/2021   ALKPHOS 59 03/23/2017   AST 16 02/06/2021   ALT 11 02/06/2021   PROT 7.3 02/06/2021   ALBUMIN 4.3 03/23/2017   CALCIUM 9.6 02/06/2021   GFRAA 120 02/06/2021   QFTBGOLD NEGATIVE 08/23/2017   QFTBGOLDPLUS NEGATIVE 02/06/2021    Speciality Comments: No specialty comments available.  Procedures:  No procedures performed Allergies: Patient has no known allergies.   Assessment / Plan:     Visit Diagnoses: Rheumatoid arthritis of multiple sites without rheumatoid factor (HCC) -  -RF, -CCP, -ANA, seronegative: She presents today with bilateral knee joint effusions.  She continues to have recurrent flares 1-2 times per month.  She is currently on Humira 40 mg subcutaneous injections every 14 days, Rasuvo 20 mg  subcu days injections once weekly, and folic acid 2 mg by mouth daily.  She has been taking ibuprofen as needed for symptomatic relief.  At her last office visit on 02/06/2021 but her tablets were discussed other treatment options but the patient declined at that time.  Due to the frequency and severity of her flares she is open to discussing treatment options at this time.  Indications, contraindications, and potential side effects of Enbrel were discussed today in detail.  All questions were addressed and consent was obtained.  We will apply for Enbrel through her insurance and once approved she will return to the office for administration of the first injection.  She will remain on Rasuvo and folic acid as prescribed.  Refills of both medications will be sent to the pharmacy today.  A prednisone taper starting at 20 mg tapering by 5 mg every 4 days will be sent to the pharmacy.  She will follow-up in  the office in 8 weeks to assess her response to Enbrel.  Medication counseling:   TB Test: Negative 02/06/21  Hepatitis panel: negative 07/23/15 HIV: negative 07/23/15 SPEP: Nonspecific pattern 07/23/15 Immunoglobulins: WNL on 07/23/15  Chest x-ray: No acute cardiopulmonary findings on 07/23/15  Does patient have diagnosis of heart failure?  No  Counseled patient that Enbrel is a TNF blocking agent.  Reviewed Enbrel dose of 50 mg once weekly.  Counseled patient on purpose, proper use, and adverse effects of Enbrel.  Reviewed the most common adverse effects including infections, headache, and injection site reactions. Discussed that there is the possibility of an increased risk of malignancy but it is not well understood if this increased risk is due to the medication or the disease state.  Advised patient to get yearly dermatology exams due to risk of skin cancer.  Reviewed the importance of regular labs while on Enbrel therapy.  Advised patient to get standing labs one month after starting Enbrel then every 2  months.  Provided patient with standing lab orders.  Counseled patient that Enbrel should be held prior to scheduled surgery.  Counseled patient to avoid live vaccines while on Enbrel.  Advised patient to get annual influenza vaccine and the pneumococcal vaccine as needed.  Provided patient with medication education material and answered all questions.  Patient voiced understanding.  Patient consented to Enbrel.  Will upload consent into the media tab.  Reviewed storage instructions for Enbrel.  Advised initial injection must be administered in office.  Patient voiced understanding.    High risk medication use - Applying for Enbrel 50 mg sq injections once weekly. She will remain on Rasuvo 20 mg sq injections every 7 days and folic acid 1 mg 2 tablets daily. Inadequate response to Humira. CBC and CMP were drawn on 02/06/2021.  She is overdue to update lab work.  Orders for CBC and CMP were released today.  Her next lab work will be due in 1 month and every 3 months after starting on Enbrel.  TB Gold negative on 02/06/2021 and will continue to be monitored yearly. She has not had any recent infections.  Discussed the importance of holding Enbrel if she develops signs or symptoms of an infection and to resume once infection has completely cleared. Discussed the importance of yearly skin examinations while on Enbrel due to the increased risk for nonmelanoma skin cancer.  Bilateral knee effusions: She continues to have recurrent bilateral knee joint effusions.  She has a moderate effusion in the right knee and small effusion in the left knee joint currently.  A prednisone taper starting 20 mg tapering by 5 mg every 4 days was sent to the pharmacy.  Vitamin D deficiency  Other medical conditions are listed as follows:  Arnold-Chiari malformation (HCC)  History of tubal ligation    Orders: Orders Placed This Encounter  Procedures   COMPLETE METABOLIC PANEL WITH GFR   CBC with Differential/Platelet     Meds ordered this encounter  Medications   predniSONE (DELTASONE) 5 MG tablet    Sig: Take 4 tablets by mouth daily x4 days, 3 tablets daily x4 days, 2 tablets daily x4 days, 1 tablet daily x4 days.    Dispense:  40 tablet    Refill:  0   Methotrexate, PF, (RASUVO) 20 MG/0.4ML SOAJ    Sig: INJECT 20MG  INTO THE SKIN ONCE WEEKLY    Dispense:  4.8 mL    Refill:  0   folic acid (FOLVITE) 1 MG  tablet    Sig: Take 2 tablets (2 mg total) by mouth daily.    Dispense:  180 tablet    Refill:  3      Follow-Up Instructions: Return in about 2 months (around 09/09/2021) for Rheumatoid arthritis.   Gearldine Bienenstock, PA-C  Note - This record has been created using Dragon software.  Chart creation errors have been sought, but may not always  have been located. Such creation errors do not reflect on  the standard of medical care.

## 2021-07-10 ENCOUNTER — Other Ambulatory Visit: Payer: Self-pay

## 2021-07-10 ENCOUNTER — Telehealth: Payer: Self-pay | Admitting: Pharmacist

## 2021-07-10 ENCOUNTER — Encounter: Payer: Self-pay | Admitting: Physician Assistant

## 2021-07-10 ENCOUNTER — Ambulatory Visit: Payer: No Typology Code available for payment source | Admitting: Physician Assistant

## 2021-07-10 VITALS — BP 117/73 | HR 86 | Resp 14 | Ht 61.0 in | Wt 140.0 lb

## 2021-07-10 DIAGNOSIS — E559 Vitamin D deficiency, unspecified: Secondary | ICD-10-CM

## 2021-07-10 DIAGNOSIS — M25461 Effusion, right knee: Secondary | ICD-10-CM | POA: Diagnosis not present

## 2021-07-10 DIAGNOSIS — G8929 Other chronic pain: Secondary | ICD-10-CM

## 2021-07-10 DIAGNOSIS — M0609 Rheumatoid arthritis without rheumatoid factor, multiple sites: Secondary | ICD-10-CM | POA: Diagnosis not present

## 2021-07-10 DIAGNOSIS — Z79899 Other long term (current) drug therapy: Secondary | ICD-10-CM | POA: Diagnosis not present

## 2021-07-10 DIAGNOSIS — Q0700 Arnold-Chiari syndrome without spina bifida or hydrocephalus: Secondary | ICD-10-CM

## 2021-07-10 DIAGNOSIS — Z9851 Tubal ligation status: Secondary | ICD-10-CM

## 2021-07-10 DIAGNOSIS — M25462 Effusion, left knee: Secondary | ICD-10-CM

## 2021-07-10 LAB — CBC WITH DIFFERENTIAL/PLATELET
Absolute Monocytes: 426 cells/uL (ref 200–950)
Basophils Absolute: 61 cells/uL (ref 0–200)
Basophils Relative: 0.8 %
Eosinophils Absolute: 114 cells/uL (ref 15–500)
Eosinophils Relative: 1.5 %
HCT: 47.4 % — ABNORMAL HIGH (ref 35.0–45.0)
Hemoglobin: 15.7 g/dL — ABNORMAL HIGH (ref 11.7–15.5)
Lymphs Abs: 2006 cells/uL (ref 850–3900)
MCH: 28.8 pg (ref 27.0–33.0)
MCHC: 33.1 g/dL (ref 32.0–36.0)
MCV: 86.8 fL (ref 80.0–100.0)
MPV: 11.5 fL (ref 7.5–12.5)
Monocytes Relative: 5.6 %
Neutro Abs: 4993 cells/uL (ref 1500–7800)
Neutrophils Relative %: 65.7 %
Platelets: 294 10*3/uL (ref 140–400)
RBC: 5.46 10*6/uL — ABNORMAL HIGH (ref 3.80–5.10)
RDW: 11.8 % (ref 11.0–15.0)
Total Lymphocyte: 26.4 %
WBC: 7.6 10*3/uL (ref 3.8–10.8)

## 2021-07-10 LAB — COMPLETE METABOLIC PANEL WITH GFR
AG Ratio: 1.6 (calc) (ref 1.0–2.5)
ALT: 9 U/L (ref 6–29)
AST: 20 U/L (ref 10–35)
Albumin: 4.5 g/dL (ref 3.6–5.1)
Alkaline phosphatase (APISO): 82 U/L (ref 37–153)
BUN: 14 mg/dL (ref 7–25)
CO2: 22 mmol/L (ref 20–32)
Calcium: 9.4 mg/dL (ref 8.6–10.4)
Chloride: 107 mmol/L (ref 98–110)
Creat: 0.67 mg/dL (ref 0.50–1.03)
Globulin: 2.8 g/dL (calc) (ref 1.9–3.7)
Glucose, Bld: 91 mg/dL (ref 65–99)
Potassium: 4.3 mmol/L (ref 3.5–5.3)
Sodium: 139 mmol/L (ref 135–146)
Total Bilirubin: 0.6 mg/dL (ref 0.2–1.2)
Total Protein: 7.3 g/dL (ref 6.1–8.1)
eGFR: 106 mL/min/{1.73_m2} (ref >=60)

## 2021-07-10 MED ORDER — FOLIC ACID 1 MG PO TABS: 2.0000 mg | ORAL_TABLET | Freq: Every day | ORAL | 3 refills | Status: DC

## 2021-07-10 MED ORDER — RASUVO 20 MG/0.4ML ~~LOC~~ SOAJ: SUBCUTANEOUS | 0 refills | Status: DC

## 2021-07-10 MED ORDER — PREDNISONE 5 MG PO TABS: ORAL_TABLET | ORAL | 0 refills | Status: DC

## 2021-07-10 NOTE — Addendum Note (Signed)
Addended by: Gearldine Bienenstock on: 07/10/2021 09:18 AM   Modules accepted: Orders

## 2021-07-10 NOTE — Telephone Encounter (Signed)
Please start Enbrel Mini BIV.  Dose: 50mg  every 7 days  Dx: Rheumatoid arthritis (M05.9)  Previously tried therapies: Humira - inadequate response  Current regimen: Humira + Rasuvo

## 2021-07-10 NOTE — Progress Notes (Signed)
Pharmacy Note  Subjective: Patient presents today to the [facility] for follow up office visit.  Patient seen by the pharmacist for counseling on Enbrel for rheumatoid arthritis. She is transitioning from Humira. She continues to take Rasuvo 20 mg weekly . Diagnosis of heart failure: No  Objective:  CBC    Component Value Date/Time   WBC 7.8 02/06/2021 0819   RBC 5.16 (H) 02/06/2021 0819   HGB 15.4 02/06/2021 0819   HCT 45.9 (H) 02/06/2021 0819   PLT 329 02/06/2021 0819   MCV 89.0 02/06/2021 0819   MCH 29.8 02/06/2021 0819   MCHC 33.6 02/06/2021 0819   RDW 11.8 02/06/2021 0819   LYMPHSABS 1,778 02/06/2021 0819   MONOABS 475 03/23/2017 1053   EOSABS 156 02/06/2021 0819   BASOSABS 62 02/06/2021 0819     CMP     Component Value Date/Time   NA 141 02/06/2021 0819   K 4.6 02/06/2021 0819   CL 104 02/06/2021 0819   CO2 29 02/06/2021 0819   GLUCOSE 94 02/06/2021 0819   BUN 16 02/06/2021 0819   CREATININE 0.65 02/06/2021 0819   CALCIUM 9.6 02/06/2021 0819   PROT 7.3 02/06/2021 0819   ALBUMIN 4.3 03/23/2017 1053   AST 16 02/06/2021 0819   ALT 11 02/06/2021 0819   ALKPHOS 59 03/23/2017 1053   BILITOT 0.8 02/06/2021 0819   GFRNONAA 104 02/06/2021 0819   GFRAA 120 02/06/2021 0819     Baseline Immunosuppressant Therapy Labs TB GOLD Quantiferon TB Gold Latest Ref Rng & Units 02/06/2021  Quantiferon TB Gold Plus NEGATIVE NEGATIVE   Hepatitis Panel on 07/23/15 (labs collected from [facility]) Hepatitis B surface antigen - negative Hepatitis C antibody - negative Hepatitis B core antibody, IgM - non-reactive Hepatitis A antibody, IgM - non-reactive  HIV on 07/23/15 (labs collected from [facility]) HIV Ag/Ab (4th gen) - non reactive  Immunoglobulins wnl on 07/23/15 (labs collected from [facility]) IgG 984 IgA 98 IgM 308  SPEP Serum Protein Electrophoresis Latest Ref Rng & Units 02/06/2021  Total Protein 6.1 - 8.1 g/dL 7.3   Per SRS 37/6/28 Albumin 3.7 Alpha 1 - 0.4 Alpha 2  - 0.8 Beta 1 - 0.4 Beta 2 - 0.3 Gamma 1.1 Ratio 1.22 Total protein: 6.8  G6PD No results found for: G6PDH TPMT No results found for: TPMT   Chest x-ray: 07/23/15 - no active cardiopulmonary findings  Assessment/Plan:  Counseled patient that Enbrel is a TNF blocking agent.  Counseled patient on purpose, proper use, and adverse effects of Enbrel.  Reviewed the most common adverse effects including infections, headache, and injection site reactions.  Discussed that there is the possibility of an increased risk of malignancy including non-melanoma skin cancer but it is not well understood if this increased risk is due to the medication or the disease state.  Advised patient to get yearly dermatology exams due to risk of skin cancer.  Counseled patient that Enbrel should be held prior to scheduled surgery.  Counseled patient to avoid live vaccines while on Enbrel.  Recommend annual influenza, PCV 15 or PCV20 or Pneumovax 23, and Shingrix as indicated.  Reviewed the importance of regular labs while on Enbrel therapy.  Will monitor CBC and CMP 1 month after starting and then every 3 months routinely thereafter. Will monitor TB gold annually. Standing orders placed. Provided patient with medication education material and answered all questions.  Patient consented to Enbrel.  Will upload consent into the media tab.  Reviewed storage instructions for Enbrel.  Advised initial injection must be administered in office.    She sees dermatology yearly since starting Humira.  Her last dose of Humira was on 07/08/21 and she will be able to start Enbrel on or after 07/22/21. She will continue Rasuvo 20mg  once weekly  , PharmD, MPH, BCPS Clinical Pharmacist (Rheumatology and Pulmonology)

## 2021-07-10 NOTE — Telephone Encounter (Signed)
Received notice that Rasuvo required PA renewal.  Submitted a Prior Authorization request to [employer] for RASUVO via CoverMyMeds. Will update once we receive a response.   Key: [id number] - PA Case ID: [id number]

## 2021-07-10 NOTE — Telephone Encounter (Signed)
Submitted a Prior Authorization request to [facility] for ENBREL via CoverMyMeds. Will update once we receive a response.   Key: [id number]

## 2021-07-10 NOTE — Patient Instructions (Signed)
Standing Labs We placed an order today for your standing lab work.   Please have your standing labs drawn in 1 month then every 3 months   If possible, please have your labs drawn 2 weeks prior to your appointment so that the provider can discuss your results at your appointment.  Please note that you may see your imaging and lab results in MyChart before we have reviewed them. We may be awaiting multiple results to interpret others before contacting you. Please allow our office up to 72 hours to thoroughly review all of the results before contacting the office for clarification of your results.  We have open lab daily: Monday through Thursday from 1:30-4:30 PM and Friday from 1:30-4:00 PM at the office of Dr. Shaili Deveshwar, [facility].   Please be advised, all patients with office appointments requiring lab work will take precedent over walk-in lab work.  If possible, please come for your lab work on Monday and Friday afternoons, as you may experience shorter wait times. The office is located at [address], [address], [place], [place] [address] No appointment is necessary.   Labs are drawn by [facility]. Please bring your co-pay at the time of your lab draw.  You may receive a bill from [facility] for your lab work.  If you wish to have your labs drawn at another location, please call the office 24 hours in advance to send orders.  If you have any questions regarding directions or hours of operation,  please call [phone].   As a reminder, please drink plenty of water prior to coming for your lab work. Thanks!    Etanercept Injection What is this medication? ETANERCEPT (et a NER sept) is used for the treatment of rheumatoid arthritis. The medicine is also used to treat psoriatic arthritis, ankylosing spondylitis, and psoriasis. This medicine may be used for other purposes; ask your health care provider or pharmacist if you have questions. COMMON BRAND NAME(S):  Enbrel What should I tell my care team before I take this medication? They need to know if you have any of these conditions: bleeding disorder cancer diabetes granulomatosis with polyangiitis heart failure HIV or AIDs immune system problems infection such as tuberculosis (TB) or other bacterial, fungal or viral infections liver disease nervous system problems such as Guillain-Barre syndrome, multiple sclerosis or seizures recent or upcoming vaccine an unusual or allergic reaction to etanercept, other medicines, latex, rubber, food, dyes, or preservatives pregnant or trying to get pregnant breast-feeding How should I use this medication? The medicine is injected under the skin. You will be taught how to prepare and give it. Take it as directed on the prescription label. Keep taking it unless your health care provider tells you stop. This medicine comes with INSTRUCTIONS FOR USE. Ask your pharmacist for directions on how to use this medicine. Read the information carefully. Talk to your pharmacist or health care provider if you have questions. If you use a pen, be sure to take off the outer needle cover before using the dose. It is important that you put your used needles and syringes in a special sharps container. Do not put them in a trash can. If you do not have a sharps container, call your pharmacist or health care provider to get one. A special MedGuide will be given to you by the pharmacist with each prescription and refill. Be sure to read this information carefully each time. Talk to your health care provider about the use of this   medicine in children. While it may be prescribed for children as young as 2 years of age for selected conditions, precautions do apply. Overdosage: If you think you have taken too much of this medicine contact a poison control center or emergency room at once. NOTE: This medicine is only for you. Do not share this medicine with others. What if I miss a  dose? If you miss a dose, take it as soon as you can. If it is almost time for your next dose, take only that dose. Do not take double or extra doses. What may interact with this medication? Do not take this medicine with any of the following medications: biologic medicines such as adalimumab, certolizumab, golimumab, infliximab live vaccines rilonacept This medicine may also interact with the following medications: abatacept anakinra biologic medicines such as anifrolumab, baricitinib, belimumab, canakinumab, natalizumab, rituximab, sarilumab, tocilizumab, tofacitinib, upadacitinib, vedolizumab cyclophosphamide sulfasalazine This list may not describe all possible interactions. Give your health care provider a list of all the medicines, herbs, non-prescription drugs, or dietary supplements you use. Also tell them if you smoke, drink alcohol, or use illegal drugs. Some items may interact with your medicine. What should I watch for while using this medication? Visit your health care provider for regular checks on your progress. Tell your health care provider if your symptoms do not start to get better or if they get worse. This medicine may increase your risk of getting an infection. Call your health care provider for advice if you get a fever, chills, sore throat, or other symptoms of a cold or flu. Do not treat yourself. Try to avoid being around people who are sick. If you have not had the measles or chickenpox vaccines, tell your health care provider right away if you are around someone with these viruses. You will be tested for tuberculosis (TB) before you start this medicine. If your doctor prescribes any medicine for TB, you should start taking the TB medicine before starting this medicine. Make sure to finish the full course of TB medicine. Avoid taking medicines that contain aspirin, acetaminophen, ibuprofen, naproxen, or ketoprofen unless instructed by your health care provider. These  medicines may hide fever. Talk to your health care provider about your risk of cancer. You may be more at risk for certain types of cancer if you take this medicine. This medicine can decrease the response to a vaccine. If you need to get vaccinated, tell your health care provider if you have received this medicine. Extra booster doses may be needed. Talk to your health care provider to see if a different vaccination schedule is needed. What side effects may I notice from receiving this medication? Side effects that you should report to your health care provider as soon as possible: allergic reactions (skin rash, itching or hives; swelling of the face, lips, or tongue) changes in vision dizziness heart failure (trouble breathing; fast, irregular heartbeat; sudden weight gain; swelling of the ankles, feet, hands; unusually weak or tired) infection (fever, chills, cough, sore throat, pain or trouble passing urine) light-colored stools liver injury (dark yellow or brown urine; general ill feeling or flu-like symptoms; loss of appetite, right upper belly pain; unusually weak or tired, yellowing of the eyes or skin) low red blood cell counts (trouble breathing; feeling faint; lightheaded, falls; unusually weak or tired) pain, tingling, numbness in the hands or feet red, scaly patches or raised bumps on the skin seizures unusual bruising or bleeding weakness in the arms or legs Side effects   that usually do not require medical attention (report to your health care provider if they continue or are bothersome): headache nausea pain, redness, or irritation at site where injected vomiting This list may not describe all possible side effects. Call your doctor for medical advice about side effects. You may report side effects to FDA at 1-800-FDA-1088. Where should I keep my medication? Keep out of the reach of children and pets. See product for storage information. Each product may have different  instructions. Get rid of any unused medicine after the expiration date. To get rid of medicines that are no longer needed or have expired: Take the medicine to a medicine take-back program. Check with your pharmacy or law enforcement to find a location. If you cannot return the medicine, ask your pharmacist or health care provider how to get rid of this medicine safely. NOTE: This sheet is a summary. It may not cover all possible information. If you have questions about this medicine, talk to your doctor, pharmacist, or health care provider.  2022 Elsevier/Gold Standard (2020-07-19 12:59:29)  

## 2021-07-11 NOTE — Telephone Encounter (Addendum)
Received a fax regarding Prior Authorization from [facility] for ENBREL. Authorization has been DENIED because:   Per your health plan's criteria, this drug is covered if you meet the following:   (1) One of the following:         (A) All of the following:                (I) You have tried or cannot use (or attestation demonstrating a trial may be                      inappropriate to) one of the following: Cimzia, Rinvoq,                      Simponi/Simponi Aria, Xeljanz/Xeljanz XR.                (II) You have tried or cannot use Actemra and Orencia.         (B) You are continuing the therapy.  Appeal Phone# [phone] Appeal Fax# [phone]

## 2021-07-11 NOTE — Progress Notes (Signed)
Hemoglobin is mildly elevated and stable.  CMP is normal.

## 2021-07-14 ENCOUNTER — Telehealth: Payer: Self-pay | Admitting: Pharmacist

## 2021-07-14 NOTE — Telephone Encounter (Signed)
Will start Simponi BIV in new telephone encounter.  Chesley Mires, PharmD, MPH, BCPS Clinical Pharmacist (Rheumatology and Pulmonology)

## 2021-07-14 NOTE — Telephone Encounter (Signed)
Submitted a Prior Authorization request to [facility] for SIMPONI SQ 50mg  every month via CoverMyMeds with clinical questions and attached most recent OV notes. Will update once we receive a response.  Key:  [id number], PharmD, MPH, BCPS Clinical Pharmacist (Rheumatology and Pulmonology)

## 2021-07-14 NOTE — Telephone Encounter (Signed)
Ok to proceed with BIV for simponi.  Thank you!

## 2021-07-15 ENCOUNTER — Other Ambulatory Visit (HOSPITAL_COMMUNITY): Payer: Self-pay

## 2021-07-15 NOTE — Telephone Encounter (Signed)
ATC patient to schedule Simponi new start. Phone rang once and went ot VM. Left detailed VM stating Enbrel was non-preferred and Simponi was approved (same med class). Requested return call to schedule new start  Chesley Mires, PharmD, MPH, BCPS Clinical Pharmacist (Rheumatology and Pulmonology)

## 2021-07-15 NOTE — Telephone Encounter (Signed)
Received notification from [facility] regarding a prior authorization for [facility]. Authorization has been APPROVED from 07/14/21 to 07/14/22.   Unable to run test claim as patient is locked into [employer].  Patient must fill through [facility]: [phone]   Authorization # [id number]  Enrolled patient into Simponi savings card: BIN: [id number] Group: [id number] ID: [id number]

## 2021-07-16 ENCOUNTER — Telehealth: Payer: Self-pay

## 2021-07-16 NOTE — Telephone Encounter (Signed)
Submitted a Prior Authorization request to [facility] for RASUVO via CoverMyMeds. Will update once we receive a response.   Key: [id number] - PA Case ID: [id number]

## 2021-07-16 NOTE — Telephone Encounter (Signed)
[facility] pharmacy left a voicemail stating they need a prior authorization in order to refill the Rasuvo prescription.  They received the prescription on 07/10/21 from Sherron Ales.  Please call to let us know the authorizations were received and if any assistance is needed.  Phone # [phone] Rasuvo prescription number [phone]

## 2021-07-16 NOTE — Telephone Encounter (Signed)
Received a fax regarding Prior Authorization from [facility] for RASUVO. Authorization has been DENIED because patient has to show that Rasuvo is working for your condition.  Case # [id number]  Will submit appeal

## 2021-07-17 ENCOUNTER — Telehealth: Payer: Self-pay | Admitting: Rheumatology

## 2021-07-17 NOTE — Telephone Encounter (Signed)
ATC #2 patient to review that Rasuvo was approved and schedule Simponi new start. Unable to reach. Left VM requesting return call  Chesley Mires, PharmD, MPH, BCPS Clinical Pharmacist (Rheumatology and Pulmonology)

## 2021-07-17 NOTE — Telephone Encounter (Signed)
Patient scheduled Simponi new start for 07/22/21. Called patient to confirm that her last Humira dose was on or after 07/08/21. Unable to reach patient but left detailed VM stating that if her Humira dose was after 07/08/21, we will have to wait for 2 weeks from that dose date to start Simponi  Chesley Mires, PharmD, MPH, BCPS Clinical Pharmacist (Rheumatology and Pulmonology)

## 2021-07-17 NOTE — Telephone Encounter (Signed)
ATC patient to review that Rasuvo was approved and schedule Simponi new start. Unable to reach. Left VM requesting return call  Chesley Mires, PharmD, MPH, BCPS Clinical Pharmacist (Rheumatology and Pulmonology)

## 2021-07-17 NOTE — Telephone Encounter (Signed)
Received notification from [facility] regarding a prior authorization for RASUVO. Authorization has been APPROVED from 07/16/21 to 07/16/22.   Authorization # [id number]

## 2021-07-17 NOTE — Telephone Encounter (Signed)
Patient called to schedule new start Simponi appointment. I scheduled patient for Tuesday 07/22/2021 at 9:00 am. If this needs to be changed, or you need to discuss anything with patient before this please let patient know.

## 2021-07-22 ENCOUNTER — Ambulatory Visit: Payer: No Typology Code available for payment source | Admitting: Pharmacist

## 2021-07-22 ENCOUNTER — Other Ambulatory Visit: Payer: Self-pay

## 2021-07-22 VITALS — BP 124/85 | HR 80

## 2021-07-22 DIAGNOSIS — Z79899 Other long term (current) drug therapy: Secondary | ICD-10-CM

## 2021-07-22 DIAGNOSIS — M0609 Rheumatoid arthritis without rheumatoid factor, multiple sites: Secondary | ICD-10-CM

## 2021-07-22 DIAGNOSIS — Z7189 Other specified counseling: Secondary | ICD-10-CM

## 2021-07-22 MED ORDER — SIMPONI 50 MG/0.5ML ~~LOC~~ SOAJ: 50.0000 mg | SUBCUTANEOUS | 2 refills | Status: DC

## 2021-07-22 NOTE — Patient Instructions (Addendum)
Your next SIMPONI dose is due on 08/19/21, 11/29, and every 4 weeks thereafter  CONTINUE weekly methotrexate (with daily folic acid) as prescribed  HOLD SIMPONI if you have signs or symptoms of an infection. You can resume once you feel better or back to your baseline. HOLD SIMPONI if you start antibiotics to treat an infection. HOLD SIMPONI around the time of surgery/procedures. Your surgeon will be able to provide recommendations on when to hold BEFORE and when you are cleared to RESUME.  Pharmacy information: Your prescription will be shipped from [facility]. Their phone number is [phone] Please call to schedule shipment and confirm address. They will mail your medication to your home.  Cost information: Your copay should be affordable. If you call the pharmacy and it is not affordable, please double-check that they are billing through your copay card as secondary coverage. That copay card information is: BIN: [id number] Group: [id number] ID: [id number]  Labs are due in 1 month then every 3 months. Lab hours are from Monday to Thursday 1:30-4:30pm and Friday 1:30-4pm. You do not need an appointment if you come for labs during these times.  How to manage an injection site reaction: Remember the 5 C's: COUNTER - leave on the counter at least 30 minutes but up to overnight to bring medication to room temperature. This may help prevent stinging COLD - place something cold (like an ice gel pack or cold water bottle) on the injection site just before cleansing with alcohol. This may help reduce pain CLARITIN - use Claritin (generic name is loratadine) for the first two weeks of treatment or the day of, the day before, and the day after injecting. This will help to minimize injection site reactions CORTISONE CREAM - apply if injection site is irritated and itching CALL ME - if injection site reaction is bigger than the size of your fist, looks infected, blisters, or if you  develop hives  Vaccines You are taking a medication(s) that can suppress your immune system.  The following immunizations are recommended: Flu annually Covid-19 with boosters Td/Tdap (tetanus, diphtheria, pertussis) every 10 years Pneumonia (Prevnar 15 then Pneumovax 23 at least 1 year apart.  Alternatively, can take Prevnar 20 without needing additional dose) Shingrix: 2 doses from 4 weeks to 6 months apart  Please check with your PCP to make sure you are up to date.

## 2021-07-22 NOTE — Progress Notes (Signed)
Pharmacy Note  Subjective:   Patient presents to clinic today to receive first dose of Simponi. Her last Humira dose was on 07/08/21  Patient running a fever or have signs/symptoms of infection? No  Patient currently on antibiotics for the treatment of infection? No  Patient have any upcoming invasive procedures/surgeries? No  Objective: CMP     Component Value Date/Time   NA 139 07/10/2021 0852   K 4.3 07/10/2021 0852   CL 107 07/10/2021 0852   CO2 22 07/10/2021 0852   GLUCOSE 91 07/10/2021 0852   BUN 14 07/10/2021 0852   CREATININE 0.67 07/10/2021 0852   CALCIUM 9.4 07/10/2021 0852   PROT 7.3 07/10/2021 0852   ALBUMIN 4.3 03/23/2017 1053   AST 20 07/10/2021 0852   ALT 9 07/10/2021 0852   ALKPHOS 59 03/23/2017 1053   BILITOT 0.6 07/10/2021 0852   GFRNONAA 104 02/06/2021 0819   GFRAA 120 02/06/2021 0819   CBC    Component Value Date/Time   WBC 7.6 07/10/2021 0852   RBC 5.46 (H) 07/10/2021 0852   HGB 15.7 (H) 07/10/2021 0852   HCT 47.4 (H) 07/10/2021 0852   PLT 294 07/10/2021 0852   MCV 86.8 07/10/2021 0852   MCH 28.8 07/10/2021 0852   MCHC 33.1 07/10/2021 0852   RDW 11.8 07/10/2021 0852   LYMPHSABS 2,006 07/10/2021 0852   MONOABS 475 03/23/2017 1053   EOSABS 114 07/10/2021 0852   BASOSABS 61 07/10/2021 0852   Baseline Immunosuppressant Therapy Labs TB GOLD Quantiferon TB Gold Latest Ref Rng & Units 02/06/2021  Quantiferon TB Gold Plus NEGATIVE NEGATIVE   Hepatitis Panel on 07/23/15 (labs collected from [facility]) Hepatitis B surface antigen - negative Hepatitis C antibody - negative Hepatitis B core antibody, IgM - non-reactive Hepatitis A antibody, IgM - non-reactive   HIV on 07/23/15 (labs collected from [facility]) HIV Ag/Ab (4th gen) - non reactive   Immunoglobulins wnl on 07/23/15 (labs collected from [facility]) IgG 984 IgA 98 IgM 308  SPEP Serum Protein Electrophoresis Latest Ref Rng & Units 07/10/2021  Total Protein 6.1 - 8.1 g/dL 7.3   Per SRS 07/19/74 Albumin  3.7 Alpha 1 - 0.4 Alpha 2 - 0.8 Beta 1 - 0.4 Beta 2 - 0.3 Gamma 1.1 Ratio 1.22 Total protein: 6.8  Chest x-ray: 07/23/15 - no acute cardiopulmonary findings  Assessment/Plan:  Counseled patient that Simponi Aria is a TNF blocking agent.  Counseled patient on purpose, proper use, and adverse effects of Simponi.  Reviewed the most common adverse effects including infections, headache, and injection site reactions. Discussed that there is the possibility of an increased risk of malignancy including non-melanoma skin cancer but it is not well understood if this increased risk is due to the medication or the disease state.  Advised patient to get yearly dermatology exams due to risk of skin cancer. Counseled patient that Simponi should be held prior to scheduled surgery.  Counseled patient to avoid live vaccines while on Simponi.  Recommend annual influenza, PCV 15 or PCV20 or Pneumovax 23, and Shingrix as indicated.  Reviewed the importance of regular labs while on Simponi therapy. Will monitor CBC and CMP 1 month after starting and then every 3 months routinely thereafter. Will monitor TB gold annually. Standing orders for CBC w diff and CMP w GFR remain in place. Provided patient with medication education material and answered all questions. Patient consented to Simponi.  Will upload consent into the media tab.   She is planning to receive COVID19 booster next  month and influenza vaccine next week  Patient already sees dermatologist yearly since she was previously taking Humira.  Demonstrated proper injection technique with Simponi demo device  Patient able to demonstrate proper injection technique using the teach back method.  Patient self injected in the left lower abdomen with:  Sample Medication: Simponi 50mg /0.24mL SmartJect autoinjector NDC: [phone] Lot: [id number] Expiration: 03/2023  Patient tolerated well.  Observed for 30 mins in office for adverse reaction and none noted.    Patient is to return in 1 month for labs and 6-8 weeks for follow-up appointment.  Standing orders placed.   Simponi SQ approved through insurance .   Rx sent to: [facility]: [phone] .  Patient provided with pharmacy phone number and advised to call later this week to schedule shipment to home.  She will continue Simponi 50mg  subcut every 4 weeks in combination with methotrexate 20mg  subcut only weekly (with folic acid 2mg  daily).  All questions encouraged and answered.  Instructed patient to call with any further questions or concerns.  [phone], PharmD, MPH, BCPS Clinical Pharmacist (Rheumatology and Pulmonology)  07/22/2021 8:27 AM

## 2021-08-27 NOTE — Progress Notes (Signed)
Office Visit Note  Patient: Sheena Rivera             Date of Birth: [birth year]           MRN: [id number]             PCP: Kerin Salen, PA-C Referring: Kathaleen Bury* Visit Date: 09/10/2021 Occupation: @GUAROCC @  Subjective:  Medication monitoring    History of Present Illness: Lenisha Sweezy is a 50 y.o. female with history of seronegative rheumatoid arthritis.  She is on Simponi 50 mg sq injections once monthly, Rasuvo 20 mg sq injections once weekly, and folic acid 2 mg daily.  Patient was started on Simponi on 07/22/2021.  She has been tolerating Simponi without any side effects or injection site reactions.  She reports that her morning stiffness has resolved since switching from Humira to Simponi.  She states that she is also noticed less pain and stiffness in her knee joints.  The swelling in her knees has gone down.  She states that over the past several weeks she has had increased pain in both hands and has noticed decreased grip strength which is new since switching to Simponi. She denies any nocturnal pain.  She denies any swelling or soreness in her hands today.  She has been taking advil 2-3 days per week for pain relief.  She denies any rheumatoid nodules.  She has not had any recent rashes.  She has been sleeping well at night.  She denies any recent infections.     Activities of Daily Living:  Patient reports morning stiffness for 0  none .   Patient Denies nocturnal pain.  Difficulty dressing/grooming: Denies Difficulty climbing stairs: Reports Difficulty getting out of chair: Reports Difficulty using hands for taps, buttons, cutlery, and/or writing: Reports  Review of Systems  Constitutional:  Positive for fatigue.  HENT:  Positive for mouth dryness. Negative for mouth sores and nose dryness.   Eyes:  Positive for dryness. Negative for pain and visual disturbance.  Respiratory:  Negative for cough, hemoptysis, shortness of breath and difficulty  breathing.   Cardiovascular:  Negative for chest pain, palpitations, hypertension and swelling in legs/feet.  Gastrointestinal:  Negative for blood in stool, constipation and diarrhea.  Endocrine: Negative for increased urination.  Genitourinary:  Negative for difficulty urinating and painful urination.  Musculoskeletal:  Positive for joint pain and joint pain. Negative for joint swelling, myalgias, muscle weakness, morning stiffness, muscle tenderness and myalgias.  Skin:  Negative for color change, pallor, rash, hair loss, nodules/bumps, skin tightness, ulcers and sensitivity to sunlight.  Allergic/Immunologic: Negative for susceptible to infections.  Neurological:  Negative for dizziness, numbness, headaches and weakness.  Hematological:  Negative for bruising/bleeding tendency and swollen glands.  Psychiatric/Behavioral:  Negative for depressed mood and sleep disturbance. The patient is not nervous/anxious.    PMFS History:  Patient Active Problem List   Diagnosis Date Noted   Rheumatoid arthritis of multiple sites without rheumatoid factor (HCC) 10/01/2016   High risk medication use 10/01/2016   Vitamin D deficiency 10/01/2016   Plantar fasciitis 10/01/2016    Past Medical History:  Diagnosis Date   Arthritis    Rheumatoid arthritis (HCC)     Family History  Problem Relation Age of Onset   Ovarian cancer Mother    COPD Father    Depression Son    History reviewed. No pertinent surgical history. Social History   Social History Narrative   Not on file   Immunization History  Administered Date(s) Administered   Influenza-Unspecified 08/19/2014   Moderna Sars-Covid-2 Vaccination 11/14/2019, 12/12/2019     Objective: Vital Signs: BP 113/70 (BP Location: Left Arm, Patient Position: Sitting, Cuff Size: Normal)   Pulse 71   Resp 14   Ht 5\' 1"  (1.549 m)   Wt 142 lb (64.4 kg)   BMI 26.83 kg/m    Physical Exam Vitals and nursing note reviewed.  Constitutional:       Appearance: She is well-developed.  HENT:     Head: Normocephalic and atraumatic.  Eyes:     Conjunctiva/sclera: Conjunctivae normal.  Pulmonary:     Effort: Pulmonary effort is normal.  Abdominal:     Palpations: Abdomen is soft.  Musculoskeletal:     Cervical back: Normal range of motion.  Skin:    General: Skin is warm and dry.     Capillary Refill: Capillary refill takes less than 2 seconds.  Neurological:     Mental Status: She is alert and oriented to person, place, and time.  Psychiatric:        Behavior: Behavior normal.     Musculoskeletal Exam: C-spine, thoracic spine, lumbar spine have good range of motion with no discomfort.  Shoulder joints, elbow joints, wrist joints, MCPs, PIPs, DIPs have good range of motion with no synovitis.  Some tenderness on the ulnar aspect of the right wrist.  Complete fist formation bilaterally.  Hip joints have good range of motion with no groin pain.  No tenderness over trochanteric bursa bilaterally.  Knee joints have good range of motion with no effusion.  Ankle joints have good range of motion with no tenderness or joint swelling.  CDAI Exam: CDAI Score: 0.8  Patient Global: 4 mm; Provider Global: 4 mm Swollen: 0 ; Tender: 0  Joint Exam 09/10/2021   No joint exam has been documented for this visit   There is currently no information documented on the homunculus. Go to the Rheumatology activity and complete the homunculus joint exam.  Investigation: No additional findings.  Imaging: No results found.  Recent Labs: Lab Results  Component Value Date   WBC 7.6 07/10/2021   HGB 15.7 (H) 07/10/2021   PLT 294 07/10/2021   NA 139 07/10/2021   K 4.3 07/10/2021   CL 107 07/10/2021   CO2 22 07/10/2021   GLUCOSE 91 07/10/2021   BUN 14 07/10/2021   CREATININE 0.67 07/10/2021   BILITOT 0.6 07/10/2021   ALKPHOS 59 03/23/2017   AST 20 07/10/2021   ALT 9 07/10/2021   PROT 7.3 07/10/2021   ALBUMIN 4.3 03/23/2017   CALCIUM 9.4  07/10/2021   GFRAA 120 02/06/2021   QFTBGOLD NEGATIVE 08/23/2017   QFTBGOLDPLUS NEGATIVE 02/06/2021    Speciality Comments: No specialty comments available.  Procedures:  No procedures performed Allergies: Patient has no known allergies.   Assessment / Plan:     Visit Diagnoses: Rheumatoid arthritis of multiple sites without rheumatoid factor (HCC) - -RF, -CCP, -ANA, seronegative:  She presents today for a new start follow-up after initiating Simponi on 07/22/2021.  She has been tolerating Simponi without any side effects or injection site reactions.  She has not had any recent infections.  She is due for her next Simponi injection on 09/18/2021.  She remains on Rasuvo 20 mg subcutaneous injections every week and folic acid 2 mg daily.  Her morning stiffness has resolved since switching from Humira to Simponi.  Her knee joint pain and inflammation have also improved on current therapy.  On  examination today she has good range of motion of both knee joints with no inflammation or knee joint effusions.  She has been experiencing increased pain and stiffness in both hands specifically in the MCP joints since switching from Humira to Simponi.  It is unclear if this is due to change in activity or changes in weather.  On examination today she did not have any tenderness or synovitis in her hands. She has noticed decreased grip strength in both hands intermittently.  Discussed that I would like to give simponi a full 3 to 4 months to notice the maximum improvement prior to making any medication changes.  She will remain on combination therapy as prescribed.  We will check a sed rate today to assess for active inflammation.  She was advised to notify us if she develops increased joint pain or joint swelling.  She will follow-up in the office in 2 months to reassess the efficacy of Simponi.- Plan: Sedimentation rate  High risk medication use - Simponi 50 mg sq injections every 30 days, Rasuvo 20 mg sq  injections every 7 days and folic acid 1 mg 2 tablets daily. CBC and CMP drawn on 07/10/2021.  She is due to update lab work today.  Orders for CBC and CMP were released.  Her next lab work will be due in February and every 3 months to monitor for drug toxicity.  Standing orders for CBC and CMP remain in place.  TB Gold negative on 02/06/2021.  Future order for TB gold will be placed today.  - Plan: CBC with Differential/Platelet, COMPLETE METABOLIC PANEL WITH GFR, QuantiFERON-TB Gold Plus She has not had any recent infections.  Discussed the importance of holding Simponi and Rasuvo if she develops signs or symptoms of an infection and to resume once the infection has completely cleared. She will require yearly skin examinations while on Simponi due to the increased risk for nonmelanoma skin cancer.  Screening for tuberculosis - Future order placed today.  She will continue to require an updated TB gold on a yearly basis. Plan: QuantiFERON-TB Gold Plus  Bilateral knee effusions: Improved.  She completed the prednisone taper prescribed at her last office visit on 07/10/2021 starting at 20 mg tapering by 5 mg every 4 days.  She was started on Simponi on 07/22/2021.  She has good ROM of both knee joints on examination today with no effusion.  Her knee joint pain and stiffness have improved significantly since switching from Humira to Simponi.  She was advised to notify us if she has recurrence of symptoms.  Other medical conditions are listed as follows:   Vitamin D deficiency  Arnold-Chiari malformation ([place])  History of tubal ligation    Orders: Orders Placed This Encounter  Procedures   CBC with Differential/Platelet   COMPLETE METABOLIC PANEL WITH GFR   QuantiFERON-TB Gold Plus   Sedimentation rate   No orders of the defined types were placed in this encounter.    Follow-Up Instructions: Return in about 2 months (around 11/10/2021) for Rheumatoid arthritis.   Ofilia Neas,  PA-C  Note - This record has been created using Dragon software.  Chart creation errors have been sought, but may not always  have been located. Such creation errors do not reflect on  the standard of medical care.

## 2021-09-10 ENCOUNTER — Ambulatory Visit (INDEPENDENT_AMBULATORY_CARE_PROVIDER_SITE_OTHER): Payer: No Typology Code available for payment source | Admitting: Physician Assistant

## 2021-09-10 ENCOUNTER — Encounter: Payer: Self-pay | Admitting: Physician Assistant

## 2021-09-10 ENCOUNTER — Other Ambulatory Visit: Payer: Self-pay

## 2021-09-10 VITALS — BP 113/70 | HR 71 | Resp 14 | Ht 61.0 in | Wt 142.0 lb

## 2021-09-10 DIAGNOSIS — M25461 Effusion, right knee: Secondary | ICD-10-CM

## 2021-09-10 DIAGNOSIS — E559 Vitamin D deficiency, unspecified: Secondary | ICD-10-CM | POA: Diagnosis not present

## 2021-09-10 DIAGNOSIS — Z79899 Other long term (current) drug therapy: Secondary | ICD-10-CM | POA: Diagnosis not present

## 2021-09-10 DIAGNOSIS — Q0700 Arnold-Chiari syndrome without spina bifida or hydrocephalus: Secondary | ICD-10-CM

## 2021-09-10 DIAGNOSIS — M25462 Effusion, left knee: Secondary | ICD-10-CM

## 2021-09-10 DIAGNOSIS — M0609 Rheumatoid arthritis without rheumatoid factor, multiple sites: Secondary | ICD-10-CM

## 2021-09-10 DIAGNOSIS — Z111 Encounter for screening for respiratory tuberculosis: Secondary | ICD-10-CM

## 2021-09-10 DIAGNOSIS — Z9851 Tubal ligation status: Secondary | ICD-10-CM

## 2021-09-11 LAB — COMPLETE METABOLIC PANEL WITH GFR
AG Ratio: 1.8 (calc) (ref 1.0–2.5)
ALT: 9 U/L (ref 6–29)
AST: 16 U/L (ref 10–35)
Albumin: 4.4 g/dL (ref 3.6–5.1)
Alkaline phosphatase (APISO): 78 U/L (ref 37–153)
BUN: 14 mg/dL (ref 7–25)
CO2: 29 mmol/L (ref 20–32)
Calcium: 9.5 mg/dL (ref 8.6–10.4)
Chloride: 104 mmol/L (ref 98–110)
Creat: 0.62 mg/dL (ref 0.50–1.03)
Globulin: 2.5 g/dL (calc) (ref 1.9–3.7)
Glucose, Bld: 92 mg/dL (ref 65–99)
Potassium: 4.6 mmol/L (ref 3.5–5.3)
Sodium: 140 mmol/L (ref 135–146)
Total Bilirubin: 1 mg/dL (ref 0.2–1.2)
Total Protein: 6.9 g/dL (ref 6.1–8.1)
eGFR: 108 mL/min/{1.73_m2} (ref >=60)

## 2021-09-11 LAB — CBC WITH DIFFERENTIAL/PLATELET
Absolute Monocytes: 533 cells/uL (ref 200–950)
Basophils Absolute: 73 cells/uL (ref 0–200)
Basophils Relative: 1 %
Eosinophils Absolute: 190 cells/uL (ref 15–500)
Eosinophils Relative: 2.6 %
HCT: 44.8 % (ref 35.0–45.0)
Hemoglobin: 15 g/dL (ref 11.7–15.5)
Lymphs Abs: 2467 cells/uL (ref 850–3900)
MCH: 29.1 pg (ref 27.0–33.0)
MCHC: 33.5 g/dL (ref 32.0–36.0)
MCV: 87 fL (ref 80.0–100.0)
MPV: 10.6 fL (ref 7.5–12.5)
Monocytes Relative: 7.3 %
Neutro Abs: 4037 cells/uL (ref 1500–7800)
Neutrophils Relative %: 55.3 %
Platelets: 318 10*3/uL (ref 140–400)
RBC: 5.15 10*6/uL — ABNORMAL HIGH (ref 3.80–5.10)
RDW: 12.1 % (ref 11.0–15.0)
Total Lymphocyte: 33.8 %
WBC: 7.3 10*3/uL (ref 3.8–10.8)

## 2021-09-11 LAB — SEDIMENTATION RATE: Sed Rate: 6 mm/h (ref 0–20)

## 2021-09-12 NOTE — Progress Notes (Signed)
RBC count is borderline elevated but has improved.  Rest of CBC WNL.  CMP WNL.  ESR WNL.

## 2021-11-07 NOTE — Progress Notes (Deleted)
° °  Office Visit Note  Patient: Sheena Rivera             Date of Birth: 02-15-1971           MRN: [id number]             PCP: Jenel Lucks, PA-C Referring: Burna Cash* Visit Date: 11/19/2021 Occupation: @GUAROCC @  Subjective:  No chief complaint on file.   History of Present Illness: Sheena Rivera is a 51 y.o. female ***   Activities of Daily Living:  Patient reports morning stiffness for *** {minute/hour:19697}.   Patient {ACTIONS;DENIES/REPORTS:21021675::"Denies"} nocturnal pain.  Difficulty dressing/grooming: {ACTIONS;DENIES/REPORTS:21021675::"Denies"} Difficulty climbing stairs: {ACTIONS;DENIES/REPORTS:21021675::"Denies"} Difficulty getting out of chair: {ACTIONS;DENIES/REPORTS:21021675::"Denies"} Difficulty using hands for taps, buttons, cutlery, and/or writing: {ACTIONS;DENIES/REPORTS:21021675::"Denies"}  No Rheumatology ROS completed.   PMFS History:  Patient Active Problem List   Diagnosis Date Noted   Rheumatoid arthritis of multiple sites without rheumatoid factor ([place]) 10/01/2016   High risk medication use 10/01/2016   Vitamin D deficiency 10/01/2016   Plantar fasciitis 10/01/2016    Past Medical History:  Diagnosis Date   Arthritis    Rheumatoid arthritis ([place])     Family History  Problem Relation Age of Onset   Ovarian cancer Mother    COPD Father    Depression Son    No past surgical history on file. Social History   Social History Narrative   Not on file   Immunization History  Administered Date(s) Administered   Influenza-Unspecified 08/19/2014   Moderna Sars-Covid-2 Vaccination 11/14/2019, 12/12/2019     Objective: Vital Signs: There were no vitals taken for this visit.   Physical Exam   Musculoskeletal Exam: ***  CDAI Exam: CDAI Score: -- Patient Global: --; Provider Global: -- Swollen: --; Tender: -- Joint Exam 11/19/2021   No joint exam has been documented for this visit   There is  currently no information documented on the homunculus. Go to the Rheumatology activity and complete the homunculus joint exam.  Investigation: No additional findings.  Imaging: No results found.  Recent Labs: Lab Results  Component Value Date   WBC 7.3 09/10/2021   HGB 15.0 09/10/2021   PLT 318 09/10/2021   NA 140 09/10/2021   K 4.6 09/10/2021   CL 104 09/10/2021   CO2 29 09/10/2021   GLUCOSE 92 09/10/2021   BUN 14 09/10/2021   CREATININE 0.62 09/10/2021   BILITOT 1.0 09/10/2021   ALKPHOS 59 03/23/2017   AST 16 09/10/2021   ALT 9 09/10/2021   PROT 6.9 09/10/2021   ALBUMIN 4.3 03/23/2017   CALCIUM 9.5 09/10/2021   GFRAA 120 02/06/2021   QFTBGOLD NEGATIVE 08/23/2017   QFTBGOLDPLUS NEGATIVE 02/06/2021    Speciality Comments: No specialty comments available.  Procedures:  No procedures performed Allergies: Patient has no known allergies.   Assessment / Plan:     Visit Diagnoses: No diagnosis found.  Orders: No orders of the defined types were placed in this encounter.  No orders of the defined types were placed in this encounter.   Face-to-face time spent with patient was *** minutes. Greater than 50% of time was spent in counseling and coordination of care.  Follow-Up Instructions: No follow-ups on file.   Earnestine Mealing, CMA  Note - This record has been created using Editor, commissioning.  Chart creation errors have been sought, but may not always  have been located. Such creation errors do not reflect on  the standard of medical care.

## 2021-11-19 ENCOUNTER — Ambulatory Visit: Payer: No Typology Code available for payment source | Admitting: Rheumatology

## 2021-11-19 DIAGNOSIS — M25461 Effusion, right knee: Secondary | ICD-10-CM

## 2021-11-19 DIAGNOSIS — Q0700 Arnold-Chiari syndrome without spina bifida or hydrocephalus: Secondary | ICD-10-CM

## 2021-11-19 DIAGNOSIS — Z9851 Tubal ligation status: Secondary | ICD-10-CM

## 2021-11-19 DIAGNOSIS — Z79899 Other long term (current) drug therapy: Secondary | ICD-10-CM

## 2021-11-19 DIAGNOSIS — M0609 Rheumatoid arthritis without rheumatoid factor, multiple sites: Secondary | ICD-10-CM

## 2021-11-19 DIAGNOSIS — E559 Vitamin D deficiency, unspecified: Secondary | ICD-10-CM

## 2021-11-21 ENCOUNTER — Ambulatory Visit: Payer: No Typology Code available for payment source | Admitting: Physician Assistant

## 2021-11-24 NOTE — Progress Notes (Signed)
Office Visit Note  Patient: Sheena Rivera             Date of Birth: 04-26-1971           MRN: [id number]             PCP: Jenel Lucks, PA-C Referring: Burna Cash* Visit Date: 12/08/2021 Occupation: @GUAROCC @  Subjective:  Medication monitoring   History of Present Illness: Sheena Rivera is a 52 y.o. female with history of seronegative rheumatoid arthritis.  She is taking simponi 50 mg sq injections once monthly, rasuvo 20 mg sq injections once weekly, and folic acid 2 mg daily.  She has not missed any doses of these medications recently.  She denies any side effects or injection site reactions.  She denies any recent rheumatoid arthritis flares.  She states that she has occasional soreness in her knee joints especially after standing or walking prolonged distances while at work.  She denies any warmth or swelling in her knee joints currently.  She denies any other joint pain or inflammation recently.  She denies any new medical conditions.  She has not had any recent infections.     Activities of Daily Living:  Patient reports morning stiffness for 0 minutes.   Patient Denies nocturnal pain.  Difficulty dressing/grooming: Denies Difficulty climbing stairs: Denies Difficulty getting out of chair: Denies Difficulty using hands for taps, buttons, cutlery, and/or writing: Denies  Review of Systems  Constitutional:  Positive for fatigue.  HENT:  Negative for mouth sores, mouth dryness and nose dryness.   Eyes:  Positive for pain, itching and dryness.  Respiratory:  Negative for shortness of breath and difficulty breathing.   Cardiovascular:  Negative for chest pain and palpitations.  Gastrointestinal:  Negative for blood in stool, constipation and diarrhea.  Endocrine: Negative for increased urination.  Genitourinary:  Negative for difficulty urinating.  Musculoskeletal:  Negative for joint pain, joint pain, joint swelling, myalgias, morning stiffness,  muscle tenderness and myalgias.  Skin:  Negative for color change, rash and redness.  Allergic/Immunologic: Negative for susceptible to infections.  Neurological:  Positive for weakness. Negative for dizziness, numbness, headaches and memory loss.  Hematological:  Negative for bruising/bleeding tendency.  Psychiatric/Behavioral:  Negative for confusion.    PMFS History:  Patient Active Problem List   Diagnosis Date Noted   Rheumatoid arthritis of multiple sites without rheumatoid factor ([place]) 10/01/2016   High risk medication use 10/01/2016   Vitamin D deficiency 10/01/2016   Plantar fasciitis 10/01/2016    Past Medical History:  Diagnosis Date   Arthritis    Rheumatoid arthritis ([place])     Family History  Problem Relation Age of Onset   Ovarian cancer Mother    COPD Father    Cancer - Lung Father    Depression Son    History reviewed. No pertinent surgical history. Social History   Social History Narrative   Not on file   Immunization History  Administered Date(s) Administered   Influenza-Unspecified 08/19/2014   Moderna Sars-Covid-2 Vaccination 11/14/2019, 12/12/2019     Objective: Vital Signs: BP 125/85 (BP Location: Left Arm, Patient Position: Sitting, Cuff Size: Normal)    Pulse 87    Ht 5\' 1"  (1.549 m)    Wt 147 lb 12.8 oz (67 kg)    BMI 27.93 kg/m    Physical Exam Vitals and nursing note reviewed.  Constitutional:      Appearance: She is well-developed.  HENT:     Head: Normocephalic and atraumatic.  Eyes:     Conjunctiva/sclera: Conjunctivae normal.  Pulmonary:     Effort: Pulmonary effort is normal.  Abdominal:     General: Bowel sounds are normal.     Palpations: Abdomen is soft.  Musculoskeletal:     Cervical back: Normal range of motion.  Skin:    General: Skin is warm and dry.     Capillary Refill: Capillary refill takes less than 2 seconds.  Neurological:     Mental Status: She is alert and oriented to person, place, and time.  Psychiatric:         Behavior: Behavior normal.     Musculoskeletal Exam: C-spine, thoracic spine, lumbar spine have good range of motion.  No midline spinal tenderness or SI joint tenderness.  Shoulder joints, elbow joints, wrist joints, MCPs, PIPs, DIPs have good range of motion with no synovitis.  Complete fist formation bilaterally.  Hip joints have good range of motion with no groin pain.  No tenderness to palpation over bilateral trochanteric bursa.  Knee joints have good range of motion with no warmth or effusion.  Ankle joints have good range of motion with no tenderness or joint swelling.  CDAI Exam: CDAI Score: 0.4  Patient Global: 2 mm; Provider Global: 2 mm Swollen: 0 ; Tender: 0  Joint Exam 12/08/2021   No joint exam has been documented for this visit   There is currently no information documented on the homunculus. Go to the Rheumatology activity and complete the homunculus joint exam.  Investigation: No additional findings.  Imaging: No results found.  Recent Labs: Lab Results  Component Value Date   WBC 7.3 09/10/2021   HGB 15.0 09/10/2021   PLT 318 09/10/2021   NA 140 09/10/2021   K 4.6 09/10/2021   CL 104 09/10/2021   CO2 29 09/10/2021   GLUCOSE 92 09/10/2021   BUN 14 09/10/2021   CREATININE 0.62 09/10/2021   BILITOT 1.0 09/10/2021   ALKPHOS 59 03/23/2017   AST 16 09/10/2021   ALT 9 09/10/2021   PROT 6.9 09/10/2021   ALBUMIN 4.3 03/23/2017   CALCIUM 9.5 09/10/2021   GFRAA 120 02/06/2021   QFTBGOLD NEGATIVE 08/23/2017   QFTBGOLDPLUS NEGATIVE 02/06/2021    Speciality Comments: No specialty comments available.  Procedures:  No procedures performed Allergies: Patient has no known allergies.   Assessment / Plan:     Visit Diagnoses: Rheumatoid arthritis of multiple sites without rheumatoid factor (HCC) - -RF, -CCP, -ANA, seronegative: She has no joint tenderness or synovitis on examination.  She is clinically doing well on Simponi 50 mg subcutaneous injections  every 30 days, Rasuvo 20 mg subcutaneous injections once weekly, and folic acid 2 mg daily.  She is tolerating these medications without any side effects or injection site reactions.  She has not missed any doses of these medications recently.  She has not had any recent infections.  She has not had any signs or symptoms of a rheumatoid arthritis flare recently.  Overall she has noticed clinical improvement since switching from Humira to Simponi.  She had good range of motion of both knee joints on examination today with no warmth or effusion.  No medication changes will be made at this time.  She does not need any refills at this time.  She was advised to notify us if she develops increased joint pain or joint swelling.  She will follow-up in the office in 5 months.  High risk medication use - Simponi 50 mg sq injections every 30  days, Rasuvo 20 mg sq injections every 7 days and folic acid 1 mg 2 tablets daily. - Plan: CBC with Differential/Platelet, COMPLETE METABOLIC PANEL WITH GFR, QuantiFERON-TB Gold Plus, CBC with Differential/Platelet, COMPLETE METABOLIC PANEL WITH GFR CBC and CMP updated on 09/10/21.  She is due to update lab work.  Orders released.  Her next lab work will be due in May and every 3 months.  TB gold negative on 02/06/21.  Discussed the importance of holding simponi and rasuvo if she develop signs or symptoms of an infection and to resume once the infection has completely cleared.   Screening for tuberculosis -Future order for TB gold placed today.  Plan: QuantiFERON-TB Gold Plus  Bilateral knee effusions: Resolved.  She has good range of motion of both knee joints on examination today with no warmth or effusion.  Other medical conditions are listed as follows:  Vitamin D deficiency  History of tubal ligation  Arnold-Chiari malformation ([place])   Orders: Orders Placed This Encounter  Procedures   CBC with Differential/Platelet   COMPLETE METABOLIC PANEL WITH GFR    QuantiFERON-TB Gold Plus   CBC with Differential/Platelet   COMPLETE METABOLIC PANEL WITH GFR   No orders of the defined types were placed in this encounter.    Follow-Up Instructions: Return in about 5 months (around 05/07/2022) for Rheumatoid arthritis.   Ofilia Neas, PA-C  Note - This record has been created using Dragon software.  Chart creation errors have been sought, but may not always  have been located. Such creation errors do not reflect on  the standard of medical care.

## 2021-12-08 ENCOUNTER — Other Ambulatory Visit: Payer: Self-pay

## 2021-12-08 ENCOUNTER — Ambulatory Visit: Payer: No Typology Code available for payment source | Admitting: Physician Assistant

## 2021-12-08 ENCOUNTER — Encounter: Payer: Self-pay | Admitting: Physician Assistant

## 2021-12-08 VITALS — BP 125/85 | HR 87 | Ht 61.0 in | Wt 147.8 lb

## 2021-12-08 DIAGNOSIS — Z79899 Other long term (current) drug therapy: Secondary | ICD-10-CM

## 2021-12-08 DIAGNOSIS — E559 Vitamin D deficiency, unspecified: Secondary | ICD-10-CM | POA: Diagnosis not present

## 2021-12-08 DIAGNOSIS — M25461 Effusion, right knee: Secondary | ICD-10-CM

## 2021-12-08 DIAGNOSIS — M25462 Effusion, left knee: Secondary | ICD-10-CM

## 2021-12-08 DIAGNOSIS — M0609 Rheumatoid arthritis without rheumatoid factor, multiple sites: Secondary | ICD-10-CM | POA: Diagnosis not present

## 2021-12-08 DIAGNOSIS — Z9851 Tubal ligation status: Secondary | ICD-10-CM

## 2021-12-08 DIAGNOSIS — Q0700 Arnold-Chiari syndrome without spina bifida or hydrocephalus: Secondary | ICD-10-CM

## 2021-12-08 DIAGNOSIS — Z111 Encounter for screening for respiratory tuberculosis: Secondary | ICD-10-CM

## 2021-12-08 LAB — COMPLETE METABOLIC PANEL WITH GFR
AG Ratio: 1.6 (calc) (ref 1.0–2.5)
ALT: 10 U/L (ref 6–29)
AST: 15 U/L (ref 10–35)
Albumin: 4.5 g/dL (ref 3.6–5.1)
Alkaline phosphatase (APISO): 80 U/L (ref 37–153)
BUN: 12 mg/dL (ref 7–25)
CO2: 28 mmol/L (ref 20–32)
Calcium: 9.9 mg/dL (ref 8.6–10.4)
Chloride: 105 mmol/L (ref 98–110)
Creat: 0.66 mg/dL (ref 0.50–1.03)
Globulin: 2.8 g/dL (calc) (ref 1.9–3.7)
Glucose, Bld: 100 mg/dL — ABNORMAL HIGH (ref 65–99)
Potassium: 4.8 mmol/L (ref 3.5–5.3)
Sodium: 140 mmol/L (ref 135–146)
Total Bilirubin: 0.7 mg/dL (ref 0.2–1.2)
Total Protein: 7.3 g/dL (ref 6.1–8.1)
eGFR: 107 mL/min/{1.73_m2} (ref >=60)

## 2021-12-08 LAB — CBC WITH DIFFERENTIAL/PLATELET
Absolute Monocytes: 456 cells/uL (ref 200–950)
Basophils Absolute: 69 cells/uL (ref 0–200)
Basophils Relative: 0.8 %
Eosinophils Absolute: 241 cells/uL (ref 15–500)
Eosinophils Relative: 2.8 %
HCT: 48.3 % — ABNORMAL HIGH (ref 35.0–45.0)
Hemoglobin: 16 g/dL — ABNORMAL HIGH (ref 11.7–15.5)
Lymphs Abs: 2159 cells/uL (ref 850–3900)
MCH: 28.8 pg (ref 27.0–33.0)
MCHC: 33.1 g/dL (ref 32.0–36.0)
MCV: 87 fL (ref 80.0–100.0)
MPV: 10.5 fL (ref 7.5–12.5)
Monocytes Relative: 5.3 %
Neutro Abs: 5676 cells/uL (ref 1500–7800)
Neutrophils Relative %: 66 %
Platelets: 346 10*3/uL (ref 140–400)
RBC: 5.55 10*6/uL — ABNORMAL HIGH (ref 3.80–5.10)
RDW: 11.7 % (ref 11.0–15.0)
Total Lymphocyte: 25.1 %
WBC: 8.6 10*3/uL (ref 3.8–10.8)

## 2021-12-08 NOTE — Patient Instructions (Signed)
Standing Labs °We placed an order today for your standing lab work.  ° °Please have your standing labs drawn in May and every 3 months  ° ° °If possible, please have your labs drawn 2 weeks prior to your appointment so that the provider can discuss your results at your appointment. ° °Please note that you may see your imaging and lab results in MyChart before we have reviewed them. °We may be awaiting multiple results to interpret others before contacting you. °Please allow our office up to 72 hours to thoroughly review all of the results before contacting the office for clarification of your results. ° °We have open lab daily: °Monday through Thursday from 1:30-4:30 PM and Friday from 1:30-4:00 PM °at the office of Dr. Shaili Deveshwar, [facility].   °Please be advised, all patients with office appointments requiring lab work will take precedent over walk-in lab work.  °If possible, please come for your lab work on Monday and Friday afternoons, as you may experience shorter wait times. °The office is located at [address], [address], [place], [place] [address] °No appointment is necessary.   °Labs are drawn by [facility]. Please bring your co-pay at the time of your lab draw.  You may receive a bill from [facility] for your lab work. ° °Please note if you are on Hydroxychloroquine and and an order has been placed for a Hydroxychloroquine level, you will need to have it drawn 4 hours or more after your last dose. ° °If you wish to have your labs drawn at another location, please call the office 24 hours in advance to send orders. ° °If you have any questions regarding directions or hours of operation,  °please call [phone].   °As a reminder, please drink plenty of water prior to coming for your lab work. Thanks! ° °

## 2021-12-09 ENCOUNTER — Telehealth: Payer: Self-pay | Admitting: Rheumatology

## 2021-12-09 NOTE — Telephone Encounter (Signed)
Patient called the office requesting a return call regarding lab results.

## 2021-12-09 NOTE — Progress Notes (Signed)
CMP WNL. RBC count, hgb, and hct are borderline elevated but stable.

## 2021-12-10 NOTE — Telephone Encounter (Signed)
See Rx note for details.  

## 2022-04-23 NOTE — Progress Notes (Signed)
Office Visit Note  Patient: Sheena Rivera             Date of Birth: 12/02/1970           MRN: [id number]             PCP: Kerin Salen, PA-C Referring: Kathaleen Bury* Visit Date: 05/07/2022 Occupation: @GUAROCC @  Subjective:  Medication monitoring   History of Present Illness: Sheena Rivera is a 51 y.o. female with history of seronegative rheumatoid arthritis.  She remains on Simponi 50 mg sq injections every 30 days, Rasuvo 20 mg sq injections every 7 days and folic acid 1 mg 2 tablets daily.  She is tolerating combination therapy without any side effects or injection site reactions.  She states that she often times has difficulty remembering taking her Simponi monthly injections.  She has been compliant taking Rasuvo as prescribed.  She states that she experiences increased breakthrough pain if she is overdue for her Simponi injections.  She experiences intermittent pain and inflammation in both knee joints.  She has not had any morning stiffness or nocturnal pain.  She has been taking ibuprofen sparingly for symptomatic relief.  She has not needed to take prednisone recently.  She denies any other joint pain or joint swelling at this time. She denies any recent or recurrent infections.  She has not had any new medical conditions.  She sees her dermatologist on a yearly basis.    Activities of Daily Living:  Patient reports morning stiffness for a few minutes.   Patient Denies nocturnal pain.  Difficulty dressing/grooming: Denies Difficulty climbing stairs: Reports Difficulty getting out of chair: Reports Difficulty using hands for taps, buttons, cutlery, and/or writing: Denies  Review of Systems  Constitutional:  Positive for fatigue.  HENT:  Positive for mouth dryness. Negative for mouth sores and nose dryness.   Eyes:  Positive for dryness. Negative for pain and visual disturbance.  Respiratory:  Negative for cough, hemoptysis, shortness of breath and  difficulty breathing.   Cardiovascular:  Negative for chest pain, palpitations, hypertension and swelling in legs/feet.  Gastrointestinal:  Negative for blood in stool, constipation and diarrhea.  Endocrine: Negative for increased urination.  Genitourinary:  Negative for painful urination and involuntary urination.  Musculoskeletal:  Positive for joint pain, joint pain and morning stiffness. Negative for joint swelling, myalgias, muscle weakness, muscle tenderness and myalgias.  Skin:  Negative for color change, pallor, rash, hair loss, nodules/bumps, skin tightness, ulcers and sensitivity to sunlight.  Allergic/Immunologic: Negative for susceptible to infections.  Neurological:  Positive for headaches. Negative for dizziness, numbness and weakness.  Hematological:  Negative for swollen glands.  Psychiatric/Behavioral:  Positive for depressed mood. Negative for sleep disturbance. The patient is nervous/anxious.     PMFS History:  Patient Active Problem List   Diagnosis Date Noted   Rheumatoid arthritis of multiple sites without rheumatoid factor (HCC) 10/01/2016   High risk medication use 10/01/2016   Vitamin D deficiency 10/01/2016   Plantar fasciitis 10/01/2016    Past Medical History:  Diagnosis Date   Arthritis    Rheumatoid arthritis (HCC)     Family History  Problem Relation Age of Onset   Ovarian cancer Mother    COPD Father    Cancer - Lung Father    Depression Son    History reviewed. No pertinent surgical history. Social History   Social History Narrative   Not on file   Immunization History  Administered Date(s) Administered   Influenza-Unspecified 08/19/2014  Moderna Sars-Covid-2 Vaccination 11/14/2019, 12/12/2019     Objective: Vital Signs: BP (!) 142/89 (BP Location: Left Arm, Patient Position: Sitting, Cuff Size: Normal)   Pulse 97   Ht 5\' 1"  (1.549 m)   Wt 148 lb 12.8 oz (67.5 kg)   BMI 28.12 kg/m    Physical Exam Vitals and nursing note  reviewed.  Constitutional:      Appearance: She is well-developed.  HENT:     Head: Normocephalic and atraumatic.  Eyes:     Conjunctiva/sclera: Conjunctivae normal.  Cardiovascular:     Rate and Rhythm: Normal rate and regular rhythm.     Heart sounds: Normal heart sounds.  Pulmonary:     Effort: Pulmonary effort is normal.     Breath sounds: Normal breath sounds.  Abdominal:     General: Bowel sounds are normal.     Palpations: Abdomen is soft.  Musculoskeletal:     Cervical back: Normal range of motion.  Skin:    General: Skin is warm and dry.     Capillary Refill: Capillary refill takes less than 2 seconds.  Neurological:     Mental Status: She is alert and oriented to person, place, and time.  Psychiatric:        Behavior: Behavior normal.      Musculoskeletal Exam: C-spine, thoracic spine, and lumbar spine good ROM.  Shoulder joints, elbow joints, wrist joints, Mcps, PIPs, and DIPs good ROM with no synovitis.  Complete fist formation bilaterally.  Hip joints have good ROM with no discomfort.  Right knee warmth but no effusion noted.  Left knee has good ROM with no warmth or effusion.  Ankle joints have good ROM with no tenderness or joint swelling.   CDAI Exam: CDAI Score: 1  Patient Global: 5 mm; Provider Global: 5 mm Swollen: 0 ; Tender: 0  Joint Exam 05/07/2022   No joint exam has been documented for this visit   There is currently no information documented on the homunculus. Go to the Rheumatology activity and complete the homunculus joint exam.  Investigation: No additional findings.  Imaging: No results found.  Recent Labs: Lab Results  Component Value Date   WBC 8.6 12/08/2021   HGB 16.0 (H) 12/08/2021   PLT 346 12/08/2021   NA 140 12/08/2021   K 4.8 12/08/2021   CL 105 12/08/2021   CO2 28 12/08/2021   GLUCOSE 100 (H) 12/08/2021   BUN 12 12/08/2021   CREATININE 0.66 12/08/2021   BILITOT 0.7 12/08/2021   ALKPHOS 59 03/23/2017   AST 15  12/08/2021   ALT 10 12/08/2021   PROT 7.3 12/08/2021   ALBUMIN 4.3 03/23/2017   CALCIUM 9.9 12/08/2021   GFRAA 120 02/06/2021   QFTBGOLD NEGATIVE 08/23/2017   QFTBGOLDPLUS NEGATIVE 02/06/2021    Speciality Comments: No specialty comments available.  Procedures:  No procedures performed Allergies: Patient has no known allergies.    Assessment / Plan:     Visit Diagnoses: Rheumatoid arthritis of multiple sites without rheumatoid factor (HCC) - -RF, -CCP, -ANA, seronegative: She experiences intermittent pain and inflammation in both knee joints.  Her symptoms are exacerbated by being overdue for Simponi injections.  She has had difficulty remembering to take Simponi on a monthly basis so she has had breakthrough symptoms with these gaps in therapy.  She remains on Rasuvo 20 mg subcutaneous injections once weekly and folic acid 2 mg daily.  On examination she has some warmth in the right knee but no effusion was noted.  She is not experiencing any joint pain or joint swelling at this time.  Discussed the importance of remaining compliant with Simponi 50 mg sq injections every 30 days and Rasuvo 20 mg subcu injections once weekly.  She was advised to notify us if she develops signs or symptoms of a flare.  She will follow-up in the office in 5 months or sooner if needed.  High risk medication use - Simponi 50 mg sq injections every 30 days, Rasuvo 20 mg sq injections every 7 days and folic acid 1 mg 2 tablets daily.  She is tolerating both medications without any side effects or injection site reactions.  Discussed the importance of remaining compliant with monthly and weekly dosings as prescribed. CBC and CMP updated on 12/08/21. She is due to update lab work today.  Orders released.  Her next lab work will be due in October and every 3 months.   TB gold negative 02/06/21.  Order for TB gold released today.  She has not had any recent or recurrent infections.  Discussed the importance of holding  simponi and rasuvo if she develops signs or symptoms of an infection and to resume once the infection has completely cleared.   - Plan: QuantiFERON-TB Gold Plus, COMPLETE METABOLIC PANEL WITH GFR, CBC with Differential/Platelet She has yearly skin examinations performed by her dermatologist. No new medical conditions or recent hospitalizations.  Screening for tuberculosis -Order for TB gold released today.  Plan: QuantiFERON-TB Gold Plus  Bilateral knee effusions: She experiences intermittent pain and inflammation in her knee joints especially if she is overdue for Simponi injections.  Overall her effusions have been less frequent since switching to Simponi.  On examination today no effusion was noted.  She has some warmth in the right knee.  She declined a cortisone injection today.  She will notify us if her symptoms persist or worsen.  Other medical conditions are listed as follows:   Vitamin D deficiency   History of tubal ligation  Arnold-Chiari malformation (HCC)   Orders: Orders Placed This Encounter  Procedures   QuantiFERON-TB Gold Plus   COMPLETE METABOLIC PANEL WITH GFR   CBC with Differential/Platelet   No orders of the defined types were placed in this encounter.   Follow-Up Instructions: Return in about 5 months (around 10/07/2022) for Rheumatoid arthritis.   Gearldine Bienenstock, PA-C  Note - This record has been created using Dragon software.  Chart creation errors have been sought, but may not always  have been located. Such creation errors do not reflect on  the standard of medical care.

## 2022-05-07 ENCOUNTER — Ambulatory Visit: Payer: No Typology Code available for payment source | Admitting: Physician Assistant

## 2022-05-07 ENCOUNTER — Encounter: Payer: Self-pay | Admitting: Physician Assistant

## 2022-05-07 ENCOUNTER — Ambulatory Visit: Payer: No Typology Code available for payment source | Admitting: Rheumatology

## 2022-05-07 VITALS — BP 142/89 | HR 97 | Ht 61.0 in | Wt 148.8 lb

## 2022-05-07 DIAGNOSIS — E559 Vitamin D deficiency, unspecified: Secondary | ICD-10-CM

## 2022-05-07 DIAGNOSIS — Z79899 Other long term (current) drug therapy: Secondary | ICD-10-CM

## 2022-05-07 DIAGNOSIS — M0609 Rheumatoid arthritis without rheumatoid factor, multiple sites: Secondary | ICD-10-CM | POA: Diagnosis not present

## 2022-05-07 DIAGNOSIS — Q0700 Arnold-Chiari syndrome without spina bifida or hydrocephalus: Secondary | ICD-10-CM

## 2022-05-07 DIAGNOSIS — Z9851 Tubal ligation status: Secondary | ICD-10-CM

## 2022-05-07 DIAGNOSIS — M25461 Effusion, right knee: Secondary | ICD-10-CM

## 2022-05-07 DIAGNOSIS — M25462 Effusion, left knee: Secondary | ICD-10-CM

## 2022-05-07 DIAGNOSIS — Z111 Encounter for screening for respiratory tuberculosis: Secondary | ICD-10-CM

## 2022-05-07 NOTE — Patient Instructions (Signed)
Standing Labs We placed an order today for your standing lab work.   Please have your standing labs drawn in October and every 3 months   If possible, please have your labs drawn 2 weeks prior to your appointment so that the provider can discuss your results at your appointment.  Please note that you may see your imaging and lab results in MyChart before we have reviewed them. We may be awaiting multiple results to interpret others before contacting you. Please allow our office up to 72 hours to thoroughly review all of the results before contacting the office for clarification of your results.  We have open lab daily: Monday through Thursday from 1:30-4:30 PM and Friday from 1:30-4:00 PM at the office of Dr. Shaili Deveshwar, [facility].   Please be advised, all patients with office appointments requiring lab work will take precedent over walk-in lab work.  If possible, please come for your lab work on Monday and Friday afternoons, as you may experience shorter wait times. The office is located at [address], [address], [place], [place] [address] No appointment is necessary.   Labs are drawn by [facility]. Please bring your co-pay at the time of your lab draw.  You may receive a bill from [facility] for your lab work.  Please note if you are on Hydroxychloroquine and and an order has been placed for a Hydroxychloroquine level, you will need to have it drawn 4 hours or more after your last dose.  If you wish to have your labs drawn at another location, please call the office 24 hours in advance to send orders.  If you have any questions regarding directions or hours of operation,  please call [phone].   As a reminder, please drink plenty of water prior to coming for your lab work. Thanks!    

## 2022-05-11 LAB — COMPLETE METABOLIC PANEL WITH GFR
AG Ratio: 1.7 (calc) (ref 1.0–2.5)
ALT: 12 U/L (ref 6–29)
AST: 20 U/L (ref 10–35)
Albumin: 4.5 g/dL (ref 3.6–5.1)
Alkaline phosphatase (APISO): 88 U/L (ref 37–153)
BUN: 10 mg/dL (ref 7–25)
CO2: 26 mmol/L (ref 20–32)
Calcium: 9.5 mg/dL (ref 8.6–10.4)
Chloride: 106 mmol/L (ref 98–110)
Creat: 0.65 mg/dL (ref 0.50–1.03)
Globulin: 2.7 g/dL (calc) (ref 1.9–3.7)
Glucose, Bld: 104 mg/dL — ABNORMAL HIGH (ref 65–99)
Potassium: 4.6 mmol/L (ref 3.5–5.3)
Sodium: 141 mmol/L (ref 135–146)
Total Bilirubin: 1 mg/dL (ref 0.2–1.2)
Total Protein: 7.2 g/dL (ref 6.1–8.1)
eGFR: 107 mL/min/{1.73_m2} (ref >=60)

## 2022-05-11 LAB — CBC WITH DIFFERENTIAL/PLATELET
Absolute Monocytes: 547 cells/uL (ref 200–950)
Basophils Absolute: 57 cells/uL (ref 0–200)
Basophils Relative: 0.8 %
Eosinophils Absolute: 419 cells/uL (ref 15–500)
Eosinophils Relative: 5.9 %
HCT: 45.4 % — ABNORMAL HIGH (ref 35.0–45.0)
Hemoglobin: 15.3 g/dL (ref 11.7–15.5)
Lymphs Abs: 2038 cells/uL (ref 850–3900)
MCH: 29.1 pg (ref 27.0–33.0)
MCHC: 33.7 g/dL (ref 32.0–36.0)
MCV: 86.5 fL (ref 80.0–100.0)
MPV: 10.2 fL (ref 7.5–12.5)
Monocytes Relative: 7.7 %
Neutro Abs: 4040 cells/uL (ref 1500–7800)
Neutrophils Relative %: 56.9 %
Platelets: 320 10*3/uL (ref 140–400)
RBC: 5.25 10*6/uL — ABNORMAL HIGH (ref 3.80–5.10)
RDW: 11.8 % (ref 11.0–15.0)
Total Lymphocyte: 28.7 %
WBC: 7.1 10*3/uL (ref 3.8–10.8)

## 2022-05-11 LAB — QUANTIFERON-TB GOLD PLUS
Mitogen-NIL: >10 IU/mL
NIL: 0.06 IU/mL
QuantiFERON-TB Gold Plus: NEGATIVE
TB1-NIL: 0.03 IU/mL
TB2-NIL: <0 IU/mL

## 2022-05-11 NOTE — Progress Notes (Signed)
TB gold negative.  RBC count and hct are borderline elevated. Rest of CBC WNL.   Glucose is 104. Rest of CMP WNL.

## 2022-08-01 ENCOUNTER — Other Ambulatory Visit: Payer: Self-pay | Admitting: Physician Assistant

## 2022-08-03 NOTE — Telephone Encounter (Signed)
Next Visit: 10/27/2022  Last Visit: 05/07/2022  Last Fill: 07/10/2022  Dx: Rheumatoid arthritis of multiple sites without rheumatoid factor   Current Dose per office note on 3/35/4562: folic acid 1 mg 2 tablets daily  Okay to refill Folic Acid?

## 2022-10-20 NOTE — Progress Notes (Signed)
Office Visit Note  Patient: Sheena Rivera             Date of Birth: [birth year]           MRN: [id number]             PCP: Kerin Salen, PA-C Referring: Kathaleen Bury* Visit Date: 10/27/2022 Occupation: @GUAROCC @  Subjective:  Pain in both knees  History of Present Illness: Sheena Rivera is a 52 y.o. female with history of seronegative rheumatoid arthritis.  She states she has been taking Simponi 50 mg subcu every month and Rasuvo 20 mg subcu weekly without any interruption.  She states none of her joints are swollen except her knee joints continue to hurt.  She denies any history of morning stiffness or difficulty climbing stairs or getting up from the chair.  She states she had a flare with severe pain and swelling in her knee joints in September at the time she was given a prednisone taper which helped.    Activities of Daily Living:  Patient reports morning stiffness for 0 minutes.   Patient Denies nocturnal pain.  Difficulty dressing/grooming: Denies Difficulty climbing stairs: Denies Difficulty getting out of chair: Denies Difficulty using hands for taps, buttons, cutlery, and/or writing: Denies  Review of Systems  Constitutional:  Positive for fatigue.  HENT:  Negative for mouth sores and mouth dryness.   Eyes:  Positive for dryness.  Respiratory:  Negative for shortness of breath.   Cardiovascular:  Negative for chest pain and palpitations.  Gastrointestinal:  Negative for blood in stool, constipation and diarrhea.  Endocrine: Negative for increased urination.  Genitourinary:  Negative for involuntary urination.  Musculoskeletal:  Positive for joint pain and joint pain. Negative for gait problem, joint swelling, myalgias, muscle weakness, morning stiffness, muscle tenderness and myalgias.  Skin:  Negative for color change, rash, hair loss and sensitivity to sunlight.  Allergic/Immunologic: Negative for susceptible to infections.  Neurological:   Negative for dizziness and headaches.  Hematological:  Negative for swollen glands.  Psychiatric/Behavioral:  Negative for depressed mood and sleep disturbance. The patient is not nervous/anxious.     PMFS History:  Patient Active Problem List   Diagnosis Date Noted   Rheumatoid arthritis of multiple sites without rheumatoid factor (HCC) 10/01/2016   High risk medication use 10/01/2016   Vitamin D deficiency 10/01/2016   Plantar fasciitis 10/01/2016    Past Medical History:  Diagnosis Date   Arthritis    Rheumatoid arthritis (HCC)     Family History  Problem Relation Age of Onset   Ovarian cancer Mother    COPD Father    Cancer - Lung Father    Depression Son    History reviewed. No pertinent surgical history. Social History   Social History Narrative   Not on file   Immunization History  Administered Date(s) Administered   Influenza-Unspecified 08/19/2014   Moderna Sars-Covid-2 Vaccination 11/14/2019, 12/12/2019     Objective: Vital Signs: BP 126/88 (BP Location: Left Arm, Patient Position: Sitting, Cuff Size: Normal)   Pulse 90   Resp 14   Ht 5\' 1"  (1.549 m)   Wt 149 lb 3.2 oz (67.7 kg)   BMI 28.19 kg/m    Physical Exam Vitals and nursing note reviewed.  Constitutional:      Appearance: She is well-developed.  HENT:     Head: Normocephalic and atraumatic.  Eyes:     Conjunctiva/sclera: Conjunctivae normal.  Cardiovascular:     Rate and Rhythm: Normal rate  and regular rhythm.     Heart sounds: Normal heart sounds.  Pulmonary:     Effort: Pulmonary effort is normal.     Breath sounds: Normal breath sounds.  Abdominal:     General: Bowel sounds are normal.     Palpations: Abdomen is soft.  Musculoskeletal:     Cervical back: Normal range of motion.  Lymphadenopathy:     Cervical: No cervical adenopathy.  Skin:    General: Skin is warm and dry.     Capillary Refill: Capillary refill takes less than 2 seconds.  Neurological:     Mental Status: She  is alert and oriented to person, place, and time.  Psychiatric:        Behavior: Behavior normal.      Musculoskeletal Exam: Cervical spine was in good range of motion.  Shoulder joints, elbow joints, wrist joints, MCPs PIPs and DIPs been good range of motion with no synovitis.  Hip joints were in good range of motion.  She had warmth swelling and small effusion in bilateral knee joints.  There was no tenderness over ankles or MTPs.  CDAI Exam: CDAI Score: 4.8  Patient Global: 4 mm; Provider Global: 4 mm Swollen: 2 ; Tender: 2  Joint Exam 10/27/2022      Right  Left  Knee  Swollen Tender  Swollen Tender     Investigation: No additional findings.  Imaging: No results found.  Recent Labs: Lab Results  Component Value Date   WBC 7.1 05/07/2022   HGB 15.3 05/07/2022   PLT 320 05/07/2022   NA 141 05/07/2022   K 4.6 05/07/2022   CL 106 05/07/2022   CO2 26 05/07/2022   GLUCOSE 104 (H) 05/07/2022   BUN 10 05/07/2022   CREATININE 0.65 05/07/2022   BILITOT 1.0 05/07/2022   ALKPHOS 59 03/23/2017   AST 20 05/07/2022   ALT 12 05/07/2022   PROT 7.2 05/07/2022   ALBUMIN 4.3 03/23/2017   CALCIUM 9.5 05/07/2022   GFRAA 120 02/06/2021   QFTBGOLD NEGATIVE 08/23/2017   QFTBGOLDPLUS NEGATIVE 05/07/2022    Speciality Comments: No specialty comments available.  Procedures:  Large Joint Inj: bilateral knee on 10/27/2022 8:26 AM Indications: pain Details: 27 G 1.5 in needle, medial approach  Arthrogram: No  Medications (Right): 1.5 mL lidocaine 1 %; 40 mg triamcinolone acetonide 40 MG/ML Aspirate (Right): 0 mL Medications (Left): 1.5 mL lidocaine 1 %; 40 mg triamcinolone acetonide 40 MG/ML Aspirate (Left): 0 mL Outcome: tolerated well, no immediate complications Procedure, treatment alternatives, risks and benefits explained, specific risks discussed. Consent was given by the patient. Immediately prior to procedure a time out was called to verify the correct patient, procedure,  equipment, support staff and site/side marked as required. Patient was prepped and draped in the usual sterile fashion.     Allergies: Patient has no known allergies.   Assessment / Plan:     Visit Diagnoses: Rheumatoid arthritis of multiple sites without rheumatoid factor (HCC) - -RF, -CCP, -ANA, seronegative: Patient continues to have recurrent knee joint pain and swelling and effusion.  None of the other joints are swollen.  Patient states she has been taking Simponi 50 mg subcu monthly and Rasuvo 20 mg weekly without any interruption.  She had bilateral knee joint swelling and effusion today on examination but no other joints were swollen.  Patient tried Humira in the past with an inadequate response.  I did detailed discussion with the patient regarding different treatment options and their side effects.  After reviewing indications and side effects we discussed possible use of interleukin-6 inhibitors.  Patient wants to proceed with switching therapy.  We discussed switching to Actemra.  Information was given and consent was obtained.  We are still not sure about the compliance with the medication.  We called the pharmacy after patient left and patient has not received any refills on Simponi injections or Rasuvo injections from the pharmacy.  I left a message with the patient to call back to clarify if she is taking the medications on a regular basis.  If compliance is an issue then we may consider switching to IV Simponi before switching to Actemra.- Plan: Sedimentation rate Medication Counseling:  Counseled patient that Actemra is an IL-6 blocking agent.  Reviewed Actemra dose of 4 mg/kg every 4 weeks.  Counseled patient on purpose, proper use, and adverse effects of Actemra.  Reviewed the most common adverse effects including infections, injection site reaction, bowel injury, and rarely cancer and conditions of the nervous system.  Reviewed that the medication should be held during infections.   Discussed that there is the possibility of an increased risk of malignancy but it is not well understood if this increased risk is due to the medication or the disease state.  Counseled patient that Actemra should be held prior to scheduled surgery.  Counseled patient to avoid live vaccines while on Actemra.  Recommend patient to get annual influenza vaccine, pneumococcal vaccine and Shingrix as indicated.   Reviewed the importance of regular labs while on Actemra therapy including the need for routine lipid panel. Standing orders placed.  Provided patient with medication education material and answered all questions.  Patient voiced understanding.  Patient consented to Actemra.  Will upload consent into the media tab.  Will submit benefits investigation for Actemra.       High risk medication use - Simponi 50 mg sq injections every 30 days, Rasuvo 20 mg sq injections every 7 days and folic acid 1 mg 2 tablets daily. -CBC and CMP were normal in July 2023.  TB Gold was negative.  Need for getting labs on every 3 months basis was emphasized.  Once she starts new medication she will get labs in a month and then every 3 months.  Plan: CBC with Differential/Platelet, COMPLETE METABOLIC PANEL WITH GFR  Bilateral knee effusions -she had mild effusion in bilateral knee joints.  She is having discomfort in her knee joints.  None of the other joints are swollen.  After informed consent was obtained bilateral knee joints were prepped in sterile fashion and injected with lidocaine and Kenalog as described above.  Patient tolerated the procedure well.  Postprocedure instructions were given.  Plan: XR KNEE 3 VIEW RIGHT, XR KNEE 3 VIEW LEFT.  X-rays showed effusion in bilateral knee joints.  No significant arthritis was noted.  Dyslipidemia-she has elevated LDL.  I discussed the side effect of elevation of HDL and LDL on interleukin-6 elevators.  Patient will schedule an appointment with her PCP for the treatment of  dyslipidemia.  Vitamin D deficiency -she has history of vitamin D deficiency.  Patient states she stopped taking vitamin D.  Will check vitamin D level today.  Plan: VITAMIN D 25 Hydroxy (Vit-D Deficiency, Fractures)  History of tubal ligation  Arnold-Chiari malformation ([place])  Orders: Orders Placed This Encounter  Procedures   Large Joint Inj   XR KNEE 3 VIEW RIGHT   XR KNEE 3 VIEW LEFT   CBC with Differential/Platelet   COMPLETE METABOLIC  PANEL WITH GFR   VITAMIN D 25 Hydroxy (Vit-D Deficiency, Fractures)   Sedimentation rate   No orders of the defined types were placed in this encounter.    Follow-Up Instructions: Return in about 5 months (around 03/28/2023) for Rheumatoid arthritis.   Bo Merino, MD  Note - This record has been created using Editor, commissioning.  Chart creation errors have been sought, but may not always  have been located. Such creation errors do not reflect on  the standard of medical care.

## 2022-10-27 ENCOUNTER — Encounter: Payer: Self-pay | Admitting: Rheumatology

## 2022-10-27 ENCOUNTER — Ambulatory Visit: Payer: No Typology Code available for payment source

## 2022-10-27 ENCOUNTER — Ambulatory Visit: Payer: No Typology Code available for payment source | Attending: Rheumatology | Admitting: Rheumatology

## 2022-10-27 ENCOUNTER — Ambulatory Visit (INDEPENDENT_AMBULATORY_CARE_PROVIDER_SITE_OTHER): Payer: No Typology Code available for payment source

## 2022-10-27 ENCOUNTER — Telehealth: Payer: Self-pay | Admitting: Pharmacist

## 2022-10-27 VITALS — BP 126/88 | HR 90 | Resp 14 | Ht 61.0 in | Wt 149.2 lb

## 2022-10-27 DIAGNOSIS — M25461 Effusion, right knee: Secondary | ICD-10-CM

## 2022-10-27 DIAGNOSIS — E785 Hyperlipidemia, unspecified: Secondary | ICD-10-CM

## 2022-10-27 DIAGNOSIS — E559 Vitamin D deficiency, unspecified: Secondary | ICD-10-CM

## 2022-10-27 DIAGNOSIS — Z79899 Other long term (current) drug therapy: Secondary | ICD-10-CM

## 2022-10-27 DIAGNOSIS — M25462 Effusion, left knee: Secondary | ICD-10-CM

## 2022-10-27 DIAGNOSIS — Z9851 Tubal ligation status: Secondary | ICD-10-CM

## 2022-10-27 DIAGNOSIS — Q0700 Arnold-Chiari syndrome without spina bifida or hydrocephalus: Secondary | ICD-10-CM

## 2022-10-27 DIAGNOSIS — M0609 Rheumatoid arthritis without rheumatoid factor, multiple sites: Secondary | ICD-10-CM

## 2022-10-27 MED ORDER — LIDOCAINE HCL 1 % IJ SOLN
1.5000 mL | INTRAMUSCULAR | Status: AC | PRN
  Administered 2022-10-27: 1.5 mL

## 2022-10-27 MED ORDER — TRIAMCINOLONE ACETONIDE 40 MG/ML IJ SUSP
40.0000 mg | INTRAMUSCULAR | Status: AC | PRN
  Administered 2022-10-27: 40 mg via INTRA_ARTICULAR

## 2022-10-27 NOTE — Progress Notes (Signed)
Pharmacy Note Subjective: Patient presents today to the [facility] for follow up office visit. Patient seen by the pharmacist for counseling on Actemra for rheumatoid arthritis. She is changing from Simponi SQ which she started on 07/22/2021. Previously on Humira.  She states she is also taking methotrexate however last time the prescription was sent was in September 2022. Patient states her last dose of Rasuvo was yesterday and last dose of Simponi was 3 weeks ago.  History of hyperlipidemia: Yes  History of diverticulitis: No  Objective: CBC    Component Value Date/Time   WBC 7.1 05/07/2022 0902   RBC 5.25 (H) 05/07/2022 0902   HGB 15.3 05/07/2022 0902   HCT 45.4 (H) 05/07/2022 0902   PLT 320 05/07/2022 0902   MCV 86.5 05/07/2022 0902   MCH 29.1 05/07/2022 0902   MCHC 33.7 05/07/2022 0902   RDW 11.8 05/07/2022 0902   LYMPHSABS 2,038 05/07/2022 0902   MONOABS 475 03/23/2017 1053   EOSABS 419 05/07/2022 0902   BASOSABS 57 05/07/2022 0902    CMP     Component Value Date/Time   NA 141 05/07/2022 0902   K 4.6 05/07/2022 0902   CL 106 05/07/2022 0902   CO2 26 05/07/2022 0902   GLUCOSE 104 (H) 05/07/2022 0902   BUN 10 05/07/2022 0902   CREATININE 0.65 05/07/2022 0902   CALCIUM 9.5 05/07/2022 0902   PROT 7.2 05/07/2022 0902   ALBUMIN 4.3 03/23/2017 1053   AST 20 05/07/2022 0902   ALT 12 05/07/2022 0902   ALKPHOS 59 03/23/2017 1053   BILITOT 1.0 05/07/2022 0902   GFRNONAA 104 02/06/2021 0819   GFRAA 120 02/06/2021 0819     Baseline Immunosuppressant Therapy Labs     Latest Ref Rng & Units 05/07/2022    9:02 AM  Quantiferon TB Gold  Quantiferon TB Gold Plus NEGATIVE NEGATIVE       Latest Ref Rng & Units 05/07/2022    9:02 AM  Serum Protein Electrophoresis  Total Protein 6.1 - 8.1 g/dL 7.2    Chest x-ray: 06/21/8181 - No acute cardiopulmonary findings.  Assessment/Plan:  Counseled patient that Actemra is an IL-6 blocking agent.  Counseled patient on  purpose, proper use, and adverse effects of Actemra.  Reviewed the most common adverse effects including infections, infusion reactions, bowel injury, and rarely cancer and conditions of the nervous system.  Reviewed risk of lipid abnormalities and elevated liver enzymes. Discussed that there is the possibility of an increased risk of malignancy but it is not well understood if this increased risk is due to the medication or the disease state. Counseled patient that Actemra should be held prior to scheduled surgery for infections or new antibiotic use. Counseled patient that Actemra should be held prior to scheduled surgery. Recommend annual influenza, PCV 15 or PCV20 or Pneumovax 23, and Shingrix as indicated.  Reviewed the importance of regular labs while on Actemra therapy including the need for routine lipid panel.  Will recheck lipid panel after 4-8 weeks of starting Actemra, then every 6 months routinely. CBC and CMP will be monitored every 3 months. TB gold will be monitored annually. Standing orders placed.  Provided patient with medication education material and answered all questions.  Patient voiced understanding.  Patient consented to Actemra.  Will upload consent into the media tab.  Reviewed storage instructions of Actemra with patient.  Advised patient that initial Actemra injection must be given in the office.  Will apply for Actemra through patient's insurance for  self-administration using auto-injector/prefilled syringe.  Patient dose will be  162 mg every 14 days based on weight <100 kg .  Prescription pending lab results and/or insurance approval.  Consent was signed for both Actemra and Darcus Pester however I called [facility] after her visit and rep stated that patient never filled Simponi with their pharmacy. Dr. Estanislado Pandy believes we should try Simponi IV infusions vs switching treatment altogether. Infusions may be more effective for her. I've left VM for patient after her visit  today.  Knox Saliva, PharmD, MPH, BCPS, CPP Clinical Pharmacist (Rheumatology and Pulmonology)

## 2022-10-27 NOTE — Telephone Encounter (Addendum)
Plan to transition from Simponi SQ to Simponi IV after I speak with her on phone. Patient appears to be non-compliant with meications. I called pharmacy after her visit and they said they've never filled her medication  Left VM for patient after her OV  Knox Saliva, PharmD, MPH, BCPS, CPP Clinical Pharmacist (Rheumatology and Pulmonology)

## 2022-10-27 NOTE — Patient Instructions (Addendum)
Tocilizumab Injection What is this medication? TOCILIZUMAB (TOE si LIZ ue mab) treats autoimmune conditions, such as arthritis. It works by slowing down an overactive immune system. It may also be used to treat severe COVID-19 in people who are hospitalized. It is a monoclonal antibody. This medicine may be used for other purposes; ask your health care provider or pharmacist if you have questions. COMMON BRAND NAME(S): Actemra What should I tell my care team before I take this medication? They need to know if you have any of these conditions: Cancer Diabetes Heart disease History of or current hepatitis B infection High blood pressure High cholesterol Immune system problems Infection Liver disease Low blood counts, such as low white cell, platelet, or red cell counts Multiple sclerosis Recent or upcoming vaccine Stomach or intestine problems Stroke An unusual or allergic reaction to tocilizumab, other medications, foods, dyes, or preservatives Pregnant or trying to get pregnant Breast-feeding How should I use this medication? This medication is injected into a vein or under the skin. It may be given by your care team in a hospital or clinic setting. It may also be given at home. If you get this medication at home, you will be taught how to prepare and give it. Use it exactly as directed. Take it as directed on the prescription label. Keep taking it unless your care team tells you to stop. If you use a pen, be sure to take off the outer needle cover before using the dose. It is important that you put your used needles and syringes in a special sharps container. Do not put them in a trash can. If you do not have a sharps container, call your pharmacist or care team to get one. A special MedGuide will be given to you by the pharmacist with each prescription and refill. Be sure to read this information carefully each time. Talk to your care team about the use of this medication in children.  While it may be prescribed for children as young as 2 years for selected conditions, precautions do apply. Overdosage: If you think you have taken too much of this medicine contact a poison control center or emergency room at once. NOTE: This medicine is only for you. Do not share this medicine with others. What if I miss a dose? If you get this medication at the hospital or clinic: It is important not to miss your dose. Call your care team if you are unable to keep an appointment. If you give yourself this medication at home: If you miss a dose, take it as soon as you can. If it is almost time for your next dose, take only that dose. Do not take double or extra doses. Call your care team with questions. What may interact with this medication? Do not take this medication with any of the following: Live virus vaccines This medication may also interact with the following: Biologic medications, such as abatacept, adalimumab, anakinra, certolizumab, etanercept, golimumab, infliximab, rituximab, secukinumab, ustekinumab Certain medications for cholesterol, such as atorvastatin, lovastatin, simvastatin Cyclosporine Estrogen or progestin hormones Omeprazole Steroid medications, such as prednisone or cortisone Theophylline Vaccines Warfarin This list may not describe all possible interactions. Give your health care provider a list of all the medicines, herbs, non-prescription drugs, or dietary supplements you use. Also tell them if you smoke, drink alcohol, or use illegal drugs. Some items may interact with your medicine. What should I watch for while using this medication? Visit your care team for regular checks on  your progress. Your condition will be monitored carefully while you are receiving this medication. Tell your care team if your symptoms do not start to get better or if they get worse. You may need blood work done while taking this medication. You will be tested for tuberculosis (TB) before  you start this medication. If your care team prescribes any medication for TB, you should start taking the TB medication before starting this medication. Make sure to finish the full course of TB medication. This medication may increase your risk of getting an infection. Call your care team for advice if you get a fever, chills, sore throat, or other symptoms of a cold or flu. Do not treat yourself. Try to avoid being around people who are sick. Talk to your care team about your risk of cancer. You may be more at risk for certain types of cancers if you take this medication. What side effects may I notice from receiving this medication? Side effects that you should report to your care team as soon as possible: Allergic reactions--skin rash, itching, hives, swelling of the face, lips, tongue, or throat Infection--fever, chills, cough, sore throat, wounds that don't heal, pain or trouble when passing urine, general feeling of discomfort or being unwell Liver injury--right upper belly pain, loss of appetite, nausea, light-colored stool, dark yellow or brown urine, yellowing skin or eyes, unusual weakness or fatigue Stomach pain that is severe, does not go away, or gets worse Unusual bruising or bleeding Side effects that usually do not require medical attention (report to your care team if they continue or are bothersome): Dizziness Headache Increase in blood pressure Pain, redness, or irritation at injection site Runny or stuffy nose Sore throat Stomach pain This list may not describe all possible side effects. Call your doctor for medical advice about side effects. You may report side effects to FDA at 1-800-FDA-1088. Where should I keep my medication? Keep out of the reach of children and pets. You will be instructed on how to store this medication. Get rid of any unused medication after the expiration date on the label. To get rid of medications that are no longer needed or have expired: Take  the medication to a medication take-back program. Check with your pharmacy or law enforcement to find a location. If you cannot return the medication, ask your pharmacist or care team how to get rid of this medication safely. NOTE: This sheet is a summary. It may not cover all possible information. If you have questions about this medicine, talk to your doctor, pharmacist, or health care provider.  2023 Elsevier/Gold Standard (2022-01-09 00:00:00) Standing Labs We placed an order today for your standing lab work.   Please have your standing labs drawn in 1 month and every 3 months  Please have your labs drawn 2 weeks prior to your appointment so that the provider can discuss your lab results at your appointment.  Please note that you may see your imaging and lab results in [facility] before we have reviewed them. We will contact you once all results are reviewed. Please allow our office up to 72 hours to thoroughly review all of the results before contacting the office for clarification of your results.  Lab hours are:   Monday through Thursday from 8:00 am -12:30 pm and 1:00 pm-5:00 pm and Friday from 8:00 am-12:00 pm.  Please be advised, all patients with office appointments requiring lab work will take precedent over walk-in lab work.   Labs are drawn  by Margit Banda. Please bring your co-pay at the time of your lab draw.  You may receive a bill from [facility] for your lab work.  Please note if you are on Hydroxychloroquine and and an order has been placed for a Hydroxychloroquine level, you will need to have it drawn 4 hours or more after your last dose.  If you wish to have your labs drawn at another location, please call the office 24 hours in advance so we can fax the orders.  The office is located at [address] [place], [place], [place] [address] No appointment is necessary.    If you have any questions regarding directions or hours of operation,  please call [phone].   As a  reminder, please drink plenty of water prior to coming for your lab work. Thanks!   Vaccines You are taking a medication(s) that can suppress your immune system.  The following immunizations are recommended: Flu annually Covid-19  RSV Td/Tdap (tetanus, diphtheria, pertussis) every 10 years Pneumonia (Prevnar 15 then Pneumovax 23 at least 1 year apart.  Alternatively, can take Prevnar 20 without needing additional dose) Shingrix: 2 doses from 4 weeks to 6 months apart  Please check with your PCP to make sure you are up to date.   If you have signs or symptoms of an infection or start antibiotics: First, call your PCP for workup of your infection. Hold your medication through the infection, until you complete your antibiotics, and until symptoms resolve if you take the following: Injectable medication (Actemra, Benlysta, Cimzia, Cosentyx, Enbrel, Humira, Kevzara, Orencia, Remicade, Simponi, Stelara, Taltz, Tremfya) Methotrexate Leflunomide (Arava) Mycophenolate (Cellcept) Morrie Sheldon, Olumiant, or Rinvoq   Tocilizumab Injection What is this medication? TOCILIZUMAB (TOE si LIZ ue mab) treats autoimmune conditions, such as arthritis. It works by slowing down an overactive immune system. It may also be used to treat severe COVID-19 in people who are hospitalized. It is a monoclonal antibody. This medicine may be used for other purposes; ask your health care provider or pharmacist if you have questions. COMMON BRAND NAME(S): Actemra What should I tell my care team before I take this medication? They need to know if you have any of these conditions: Cancer Diabetes Heart disease History of or current hepatitis B infection High blood pressure High cholesterol Immune system problems Infection Liver disease Low blood counts, such as low white cell, platelet, or red cell counts Multiple sclerosis Recent or upcoming vaccine Stomach or intestine problems Stroke An unusual or allergic  reaction to tocilizumab, other medications, foods, dyes, or preservatives Pregnant or trying to get pregnant Breast-feeding How should I use this medication? This medication is injected into a vein or under the skin. It may be given by your care team in a hospital or clinic setting. It may also be given at home. If you get this medication at home, you will be taught how to prepare and give it. Use it exactly as directed. Take it as directed on the prescription label. Keep taking it unless your care team tells you to stop. If you use a pen, be sure to take off the outer needle cover before using the dose. It is important that you put your used needles and syringes in a special sharps container. Do not put them in a trash can. If you do not have a sharps container, call your pharmacist or care team to get one. A special MedGuide will be given to you by the pharmacist with each prescription and refill. Be  sure to read this information carefully each time. Talk to your care team about the use of this medication in children. While it may be prescribed for children as young as 2 years for selected conditions, precautions do apply. Overdosage: If you think you have taken too much of this medicine contact a poison control center or emergency room at once. NOTE: This medicine is only for you. Do not share this medicine with others. What if I miss a dose? If you get this medication at the hospital or clinic: It is important not to miss your dose. Call your care team if you are unable to keep an appointment. If you give yourself this medication at home: If you miss a dose, take it as soon as you can. If it is almost time for your next dose, take only that dose. Do not take double or extra doses. Call your care team with questions. What may interact with this medication? Do not take this medication with any of the following: Live virus vaccines This medication may also interact with the following: Biologic  medications, such as abatacept, adalimumab, anakinra, certolizumab, etanercept, golimumab, infliximab, rituximab, secukinumab, ustekinumab Certain medications for cholesterol, such as atorvastatin, lovastatin, simvastatin Cyclosporine Estrogen or progestin hormones Omeprazole Steroid medications, such as prednisone or cortisone Theophylline Vaccines Warfarin This list may not describe all possible interactions. Give your health care provider a list of all the medicines, herbs, non-prescription drugs, or dietary supplements you use. Also tell them if you smoke, drink alcohol, or use illegal drugs. Some items may interact with your medicine. What should I watch for while using this medication? Visit your care team for regular checks on your progress. Your condition will be monitored carefully while you are receiving this medication. Tell your care team if your symptoms do not start to get better or if they get worse. You may need blood work done while taking this medication. You will be tested for tuberculosis (TB) before you start this medication. If your care team prescribes any medication for TB, you should start taking the TB medication before starting this medication. Make sure to finish the full course of TB medication. This medication may increase your risk of getting an infection. Call your care team for advice if you get a fever, chills, sore throat, or other symptoms of a cold or flu. Do not treat yourself. Try to avoid being around people who are sick. Talk to your care team about your risk of cancer. You may be more at risk for certain types of cancers if you take this medication. What side effects may I notice from receiving this medication? Side effects that you should report to your care team as soon as possible: Allergic reactions--skin rash, itching, hives, swelling of the face, lips, tongue, or throat Infection--fever, chills, cough, sore throat, wounds that don't heal, pain or  trouble when passing urine, general feeling of discomfort or being unwell Liver injury--right upper belly pain, loss of appetite, nausea, light-colored stool, dark yellow or brown urine, yellowing skin or eyes, unusual weakness or fatigue Stomach pain that is severe, does not go away, or gets worse Unusual bruising or bleeding Side effects that usually do not require medical attention (report to your care team if they continue or are bothersome): Dizziness Headache Increase in blood pressure Pain, redness, or irritation at injection site Runny or stuffy nose Sore throat Stomach pain This list may not describe all possible side effects. Call your doctor for medical advice about side  effects. You may report side effects to FDA at 1-800-FDA-1088. Where should I keep my medication? Keep out of the reach of children and pets. You will be instructed on how to store this medication. Get rid of any unused medication after the expiration date on the label. To get rid of medications that are no longer needed or have expired: Take the medication to a medication take-back program. Check with your pharmacy or law enforcement to find a location. If you cannot return the medication, ask your pharmacist or care team how to get rid of this medication safely. NOTE: This sheet is a summary. It may not cover all possible information. If you have questions about this medicine, talk to your doctor, pharmacist, or health care provider.  2023 Elsevier/Gold Standard (2022-01-09 00:00:00)

## 2022-10-28 LAB — CBC WITH DIFFERENTIAL/PLATELET
Absolute Monocytes: 454 cells/uL (ref 200–950)
Basophils Absolute: 67 cells/uL (ref 0–200)
Basophils Relative: 0.8 %
Eosinophils Absolute: 294 cells/uL (ref 15–500)
Eosinophils Relative: 3.5 %
HCT: 45.2 % — ABNORMAL HIGH (ref 35.0–45.0)
Hemoglobin: 15.4 g/dL (ref 11.7–15.5)
Lymphs Abs: 2444 cells/uL (ref 850–3900)
MCH: 29.2 pg (ref 27.0–33.0)
MCHC: 34.1 g/dL (ref 32.0–36.0)
MCV: 85.8 fL (ref 80.0–100.0)
MPV: 10.5 fL (ref 7.5–12.5)
Monocytes Relative: 5.4 %
Neutro Abs: 5141 cells/uL (ref 1500–7800)
Neutrophils Relative %: 61.2 %
Platelets: 350 10*3/uL (ref 140–400)
RBC: 5.27 10*6/uL — ABNORMAL HIGH (ref 3.80–5.10)
RDW: 12.3 % (ref 11.0–15.0)
Total Lymphocyte: 29.1 %
WBC: 8.4 10*3/uL (ref 3.8–10.8)

## 2022-10-28 LAB — COMPLETE METABOLIC PANEL WITH GFR
AG Ratio: 1.7 (calc) (ref 1.0–2.5)
ALT: 15 U/L (ref 6–29)
AST: 16 U/L (ref 10–35)
Albumin: 4.5 g/dL (ref 3.6–5.1)
Alkaline phosphatase (APISO): 97 U/L (ref 37–153)
BUN: 10 mg/dL (ref 7–25)
CO2: 25 mmol/L (ref 20–32)
Calcium: 9.6 mg/dL (ref 8.6–10.4)
Chloride: 107 mmol/L (ref 98–110)
Creat: 0.62 mg/dL (ref 0.50–1.03)
Globulin: 2.7 g/dL (calc) (ref 1.9–3.7)
Glucose, Bld: 107 mg/dL — ABNORMAL HIGH (ref 65–99)
Potassium: 4.5 mmol/L (ref 3.5–5.3)
Sodium: 142 mmol/L (ref 135–146)
Total Bilirubin: 0.7 mg/dL (ref 0.2–1.2)
Total Protein: 7.2 g/dL (ref 6.1–8.1)
eGFR: 108 mL/min/{1.73_m2} (ref >=60)

## 2022-10-28 LAB — VITAMIN D 25 HYDROXY (VIT D DEFICIENCY, FRACTURES): Vit D, 25-Hydroxy: 21 ng/mL — ABNORMAL LOW (ref 30–100)

## 2022-10-28 LAB — SEDIMENTATION RATE: Sed Rate: 14 mm/h (ref 0–30)

## 2022-10-28 NOTE — Progress Notes (Signed)
CBC, CMP and sed rate are normal.  Vitamin D is low at 21.  Patient should take vitamin D 2000 units daily.

## 2022-10-29 ENCOUNTER — Telehealth: Payer: Self-pay | Admitting: Rheumatology

## 2022-10-29 NOTE — Telephone Encounter (Signed)
FYI:  LMOM for patient to call and schedule 2 month follow-up appointment 12/26/22

## 2022-11-02 ENCOUNTER — Telehealth: Payer: Self-pay | Admitting: Rheumatology

## 2022-11-02 NOTE — Telephone Encounter (Signed)
ATC patient to discuss Simponi IV. Unable to reach. Left VM requesting return call.  Knox Saliva, PharmD, MPH, BCPS, CPP Clinical Pharmacist (Rheumatology and Pulmonology)

## 2022-11-02 NOTE — Telephone Encounter (Signed)
Patient called stating she was returning Devki's call to discuss infusion.

## 2022-11-03 NOTE — Telephone Encounter (Signed)
Returned call to patient. Unable to reach. Left VM advising that I am only in clinic in mornings M-Thu  Knox Saliva, PharmD, MPH, BCPS, CPP Clinical Pharmacist (Rheumatology and Pulmonology)

## 2022-11-03 NOTE — Telephone Encounter (Signed)
Returned call to patient. Unable to reach. Left VM advising that I am only in clinic in mornings M-Thu  Siennah Barrasso, PharmD, MPH, BCPS, CPP Clinical Pharmacist (Rheumatology and Pulmonology) 

## 2022-11-10 NOTE — Telephone Encounter (Signed)
ATC #3 patient regarding Simponi IV infusions. Left VM requesting return call.  Knox Saliva, PharmD, MPH, BCPS, CPP Clinical Pharmacist (Rheumatology and Pulmonology)

## 2022-11-11 NOTE — Telephone Encounter (Signed)
Patient returned call with her work number [phone]). I called her back but she did not pick up.  Knox Saliva, PharmD, MPH, BCPS, CPP Clinical Pharmacist (Rheumatology and Pulmonology)

## 2022-11-12 ENCOUNTER — Other Ambulatory Visit: Payer: Self-pay | Admitting: Pharmacist

## 2022-11-12 ENCOUNTER — Encounter: Payer: Self-pay | Admitting: Rheumatology

## 2022-11-12 DIAGNOSIS — Z111 Encounter for screening for respiratory tuberculosis: Secondary | ICD-10-CM | POA: Insufficient documentation

## 2022-11-12 NOTE — Telephone Encounter (Signed)
Patient returned call regarding switching to Simponi Aria infusions. She is willing to switch to infusions. Will place referral to [facility]. Patient has Pharmacist, community. She has been advised that infusion center will reach out once approval is in place  Knox Saliva, PharmD, MPH, BCPS, CPP Clinical Pharmacist (Rheumatology and Pulmonology)

## 2022-11-12 NOTE — Progress Notes (Signed)
Patient is newly starting [place]. Diagnosis:  Dose: 2mg /kg at Week 0, Week 4, then every 8 weeks thereafter  Last Clinic Visit: 10/27/2022 Next Clinic Visit: 12/29/2022  Labs: CBC and CMP on 10/27/2022  TB Gold: negative on 05/07/2022   Orders placed for Simponi Aria along with premedication of acetaminophen and diphenhydramine to be administered 30 minutes before medication infusion.  Standing CBC with diff/platelet and CMP with GFR orders placed to be drawn every 2 months.  Next TB gold due 05/08/2023  Knox Saliva, PharmD, MPH, BCPS, CPP Clinical Pharmacist (Rheumatology and Pulmonology)

## 2022-11-20 ENCOUNTER — Telehealth: Payer: Self-pay | Admitting: Pharmacy Technician

## 2022-11-20 DIAGNOSIS — M0609 Rheumatoid arthritis without rheumatoid factor, multiple sites: Secondary | ICD-10-CM

## 2022-11-20 DIAGNOSIS — Z79899 Other long term (current) drug therapy: Secondary | ICD-10-CM

## 2022-11-20 MED ORDER — GOLIMUMAB 50 MG/4ML IV SOLN: INTRAVENOUS | 2 refills | Status: DC

## 2022-11-20 NOTE — Telephone Encounter (Addendum)
Auth Submission: APPROVED Payer: MEDCO[employer] Medication & CPT/J Code(s) submitted:  [place] Route of submission (phone, fax, portal):  Phone # [phone] Fax # [phone] Auth type: Pharmacy Benefit Units/visits requested:  Reference number: [id number] Approval from: 11/18/22 to 05/19/23   Upcoming appt: 12/11/22. When patient arrives call [facility] to give shipping consent for the remaining of the year  Medication must be filled at [facility]. Phone: [phone]  @ [facility] please send script to [facility] @[place] , fyi flag and excell sheet updated.  Co-pay card  APPROVED ID: [id number] BIN: [id number] GR: [id number] EXP: 10/01/23 PHONE: [phone] REP: [email or web address]-[email or web address]  Patient will need to call [facility]: [phone] to reactivate card or call [facility] if patient decides to pay cash. [facility] ran test claim and card would not process. Spoke with patient in regard to activating co-pay card and she understood that medication will not be delivered until $ 150 balance is paid with cash or co-pay card.  Patient was provided with Alphonsa Overall phone number along with her ID number.  Once $150 balance has been paid, delivery will be arranged and patient will be scheduled as soon as possible.

## 2022-11-20 NOTE — Telephone Encounter (Signed)
Rx for Carita Pian sent to [facility].  Dose: 2mg /kg x 67.7kg = 135.4 mg will require to rounded up to nearest 50mg  dose ---> 3 vials of 59mL ---> 48mL per infusion  Simponi copay card was activated on 07/15/21 which may need to be provided to pharmacy: BIN: [id number] Group: [id number] ID: [id number]  Knox Saliva, PharmD, MPH, BCPS, CPP Clinical Pharmacist (Rheumatology and Pulmonology)

## 2022-11-20 NOTE — Addendum Note (Signed)
Addended by: Cassandria Anger on: 11/20/2022 02:10 PM   Modules accepted: Orders

## 2022-12-04 NOTE — Progress Notes (Signed)
Simponi Aria infusion scheduled for 12/11/2022.. Will f/u to ensure completed  Knox Saliva, PharmD, MPH, BCPS, CPP Clinical Pharmacist (Rheumatology and Pulmonology)

## 2022-12-11 ENCOUNTER — Ambulatory Visit (INDEPENDENT_AMBULATORY_CARE_PROVIDER_SITE_OTHER): Payer: No Typology Code available for payment source | Admitting: *Deleted

## 2022-12-11 VITALS — BP 113/77 | HR 81 | Temp 98.5°F | Resp 16 | Ht 61.0 in | Wt 145.0 lb

## 2022-12-11 DIAGNOSIS — Z79899 Other long term (current) drug therapy: Secondary | ICD-10-CM | POA: Diagnosis not present

## 2022-12-11 DIAGNOSIS — M0609 Rheumatoid arthritis without rheumatoid factor, multiple sites: Secondary | ICD-10-CM | POA: Diagnosis not present

## 2022-12-11 MED ORDER — SODIUM CHLORIDE 0.9 % IV SOLN
2.0000 mg/kg | Freq: Once | INTRAVENOUS | Status: AC
  Administered 2022-12-11: 136.25 mg via INTRAVENOUS
  Filled 2022-12-11: qty 10.9

## 2022-12-11 MED ORDER — ACETAMINOPHEN 325 MG PO TABS
650.0000 mg | ORAL_TABLET | Freq: Once | ORAL | Status: AC
  Administered 2022-12-11: 650 mg via ORAL
  Filled 2022-12-11: qty 2

## 2022-12-11 MED ORDER — DIPHENHYDRAMINE HCL 25 MG PO CAPS
25.0000 mg | ORAL_CAPSULE | Freq: Once | ORAL | Status: AC
  Administered 2022-12-11: 25 mg via ORAL
  Filled 2022-12-11: qty 1

## 2022-12-11 NOTE — Patient Instructions (Signed)
Golimumab Injection What is this medication? GOLIMUMAB (goe LIM ue mab) treats autoimmune conditions, such as arthritis and ulcerative colitis. It works by slowing down an overactive immune system. It belongs to a group of medications called TNF inhibitors. It is a monoclonal antibody. This medicine may be used for other purposes; ask your health care provider or pharmacist if you have questions. COMMON BRAND NAME(S): Simponi, SIMPONI ARIA What should I tell my care team before I take this medication? They need to know if you have any of these conditions: Cancer Diabetes Guillain-Barre syndrome Heart failure Hepatitis B or history of hepatitis B infection Immune system problems Infection or history of infections Low blood counts, such as low white cell, platelet, or red cell counts Multiple sclerosis Psoriasis Recently received or scheduled to receive a vaccine Tuberculosis, a positive skin test for tuberculosis, or recent close contact with someone who has tuberculosis An unusual or allergic reaction to golimumab, other medications, latex, rubber, foods, dyes, or preservatives Pregnant or trying to get pregnant Breast-feeding How should I use this medication? This medication is injected into a vein or under the skin. It is usually given by your care team in a hospital or clinic setting. It may also be given at home. If you get this medication at home, you will be taught how to prepare and give it. Use exactly as directed. Take it as directed on the prescription label at the same time every day. Keep taking it unless your care team tells you to stop. It is important that you put your used injectors, needles, and syringes in a special sharps container. Do not put them in a trash can. If you do not have a sharps container, call your pharmacist or care team to get one. A special MedGuide will be given to you by the pharmacist with each prescription and refill. Be sure to read this information  carefully each time. If you get this medication in a hospital or clinic setting, a special MedGuide will be given to you before each treatment. Be sure to read this information carefully each time. Talk to your care team about the use of this medication in children. While it may be prescribed for children as young as 2 years for selected conditions, precautions do apply. Overdosage: If you think you have taken too much of this medicine contact a poison control center or emergency room at once. NOTE: This medicine is only for you. Do not share this medicine with others. What if I miss a dose? If you get this medication at the hospital or clinic: It is important not to miss your dose. Call your care team if you are unable to keep an appointment. If you give yourself this medication at home: If you miss a dose, take it as soon as you can. If it is almost time for your next dose, take only that dose. Do not take double or extra doses. Call your care team with questions. What may interact with this medication? Do not take this medication with any of the following: Abatacept Adalimumab Anakinra Certolizumab Etanercept Infliximab Live virus vaccines Rilonacept Rituximab Tocilizumab This medication may also interact with the following: Cyclosporine Theophylline Vaccines Warfarin This list may not describe all possible interactions. Give your health care provider a list of all the medicines, herbs, non-prescription drugs, or dietary supplements you use. Also tell them if you smoke, drink alcohol, or use illegal drugs. Some items may interact with your medicine. What should I watch for while  using this medication? Visit your care team for regular checks on your progress. Tell your care team if your symptoms do not start to get better or if they get worse. You will be tested for tuberculosis (TB) before you start this medication. If your care team prescribes any medication for TB, you should start  taking the TB medication before starting this medication. Make sure to finish the full course of TB medication. This medication may increase your risk of getting an infection. Call you care team if you get a fever, chills, sore throat, or other symptoms of a cold or flu. Do not treat yourself. Try to avoid being around people who are sick. Talk to your care team about your risk of cancer. You may be more at risk for certain types of cancers if you take this medication. What side effects may I notice from receiving this medication? Side effects that you should report to your care team as soon as possible: Allergic reactions--skin rash, itching, hives, swelling of the face, lips, tongue, or throat Aplastic anemia--unusual weakness or fatigue, dizziness, headache, trouble breathing, increased bleeding or bruising Body pain, tingling, or numbness Heart failure--shortness of breath, swelling of the ankles, feet, or hands, sudden weight gain, unusual weakness or fatigue Infection--fever, chills, cough, sore throat, wounds that don't heal, pain or trouble when passing urine, general feeling of discomfort or being unwell Lupus-like syndrome--joint pain, swelling, or stiffness, butterfly-shaped rash on the face, rashes that get worse in the sun, fever, unusual weakness or fatigue Unusual bruising or bleeding Side effects that usually do not require medical attention (report to your care team if they continue or are bothersome): Dizziness Fever Increase in blood pressure Runny or stuffy nose Sore throat This list may not describe all possible side effects. Call your doctor for medical advice about side effects. You may report side effects to FDA at 1-800-FDA-1088. Where should I keep my medication? Infusions will be given in a hospital or clinic. They will not be stored at home. Storage for syringes or injectors given under the skin and stored at home: Keep out of the reach of children and  pets. Refrigeration (preferred): Store in the refrigerator. Do not freeze. Do not shake. Keep this medication in the original container. Protect from light. Get rid of any unused medication after the expiration date. Room Temperature: This medication may be stored at room temperature between 20 and 25 degrees C (68 and 77 degrees F) for up to 30 days. Do not put the medication back in the refrigerator after it reaches room temperature. Keep this medication in the original container. Protect from light. Do not freeze. Do not shake. If it is stored at room temperature, get rid any unused medication after 30 days or after it expires, whichever comes first. To get rid of medications that are no longer needed or have expired: Take the medication to a medication take-back program. Check with your pharmacy or law enforcement to find a location. If you cannot return the medication, ask your pharmacist or care team how to get rid of this medication safely. NOTE: This sheet is a summary. It may not cover all possible information. If you have questions about this medicine, talk to your doctor, pharmacist, or health care provider.  2023 Elsevier/Gold Standard (2021-12-18 00:00:00)

## 2022-12-11 NOTE — Progress Notes (Signed)
Diagnosis: Rheumatoid Arthritis  Provider:  Marshell Garfinkel MD  Procedure: Infusion  IV Type: Peripheral, IV Location: L Forearm  golimumab, Dose: 136.25 mg  Infusion Start Time: [id number] am  Infusion Stop Time: [id number]  Post Infusion IV Care: Observation period completed and Peripheral IV Discontinued  Discharge: Condition: Good, Destination: Home . AVS Provided  Performed by:  Oren Beckmann, RN

## 2022-12-15 NOTE — Progress Notes (Unsigned)
Office Visit Note  Patient: Sheena Rivera             Date of Birth: 09-06-1971           MRN: [id number]             PCP: Jenel Lucks, PA-C Referring: Burna Cash* Visit Date: 12/29/2022 Occupation: @GUAROCC @  Subjective:  Medication monitoring    History of Present Illness: Cherrill People is a 52 y.o. female with history of seronegative rheumatoid arthritis.  Patient was recently switched from sq simponi to IV simponi aria to try to improve adherence and efficacy.  Her first Simponi aria infusion was administered on 12/11/22. She tolerated the infusion without any side effects. She remains on  Rasuvo 20 mg subcutaneous injections once weekly and folic acid 2 mg daily.  She denies any recent or recurrent infections.  Patient reports that initially after her first Star infusion she noticed a significant improvement in her symptoms.  She states that on Friday she started to have a flare involving both knees.  She states that by Saturday she was having difficulty ambulating due to the swelling in her knees.  She states that both knees swelled up to almost the size of basketballs.  She states that she took Advil 800 mg every 8 hours as needed which has helped to alleviate her symptoms.  She states the swelling is gradually started to improve.  She states she was also experiencing pain and swelling in the left wrist joint which has started to improve as well.  She denies any other joint pain or joint swelling at this time.  She states that her next simponi aria infusion is scheduled for next week. She denies any new medical conditions.      Activities of Daily Living:  Patient reports morning stiffness for 1-2 hours.   Patient Reports nocturnal pain.  Difficulty dressing/grooming: Denies Difficulty climbing stairs: Reports Difficulty getting out of chair: Reports Difficulty using hands for taps, buttons, cutlery, and/or writing: Reports  Review of Systems   Constitutional:  Positive for fatigue.  HENT:  Negative for mouth sores and mouth dryness.   Eyes:  Positive for dryness.  Respiratory:  Negative for shortness of breath.   Cardiovascular:  Negative for chest pain and palpitations.  Gastrointestinal:  Negative for blood in stool, constipation and diarrhea.  Endocrine: Negative for increased urination.  Genitourinary:  Negative for involuntary urination.  Musculoskeletal:  Positive for joint pain, joint pain, joint swelling and morning stiffness. Negative for gait problem, myalgias, muscle weakness, muscle tenderness and myalgias.  Skin:  Negative for color change, rash, hair loss and sensitivity to sunlight.  Allergic/Immunologic: Negative for susceptible to infections.  Neurological:  Negative for dizziness and headaches.  Hematological:  Negative for swollen glands.  Psychiatric/Behavioral:  Positive for depressed mood and sleep disturbance. The patient is not nervous/anxious.     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 ([place]) 10/01/2016   High risk medication use 10/01/2016   Vitamin D deficiency 10/01/2016   Plantar fasciitis 10/01/2016    Past Medical History:  Diagnosis Date   Arthritis    Rheumatoid arthritis ([place])     Family History  Problem Relation Age of Onset   Ovarian cancer Mother    COPD Father    Cancer - Lung Father    Depression Son  Past Surgical History:  Procedure Laterality Date   ABLATION     Social History   Social History Narrative   Not on file   Immunization History  Administered Date(s) Administered   Influenza-Unspecified 08/19/2014   Moderna Sars-Covid-2 Vaccination 11/14/2019, 12/12/2019     Objective: Vital Signs: BP 134/84 (BP Location: Left Arm, Patient Position: Sitting, Cuff Size: Normal)   Pulse 93   Resp 12   Ht 5' 1.5" (1.562 m)   Wt 147  lb (66.7 kg)   BMI 27.33 kg/m    Physical Exam Vitals and nursing note reviewed.  Constitutional:      Appearance: She is well-developed.  HENT:     Head: Normocephalic and atraumatic.  Eyes:     Conjunctiva/sclera: Conjunctivae normal.  Cardiovascular:     Rate and Rhythm: Normal rate and regular rhythm.     Heart sounds: Normal heart sounds.  Pulmonary:     Effort: Pulmonary effort is normal.     Breath sounds: Normal breath sounds.  Abdominal:     General: Bowel sounds are normal.     Palpations: Abdomen is soft.  Musculoskeletal:     Cervical back: Normal range of motion.  Skin:    General: Skin is warm and dry.     Capillary Refill: Capillary refill takes less than 2 seconds.  Neurological:     Mental Status: She is alert and oriented to person, place, and time.  Psychiatric:        Behavior: Behavior normal.      Musculoskeletal Exam: C-spine, thoracic spine, lumbar spine have good range of motion.  Shoulder joints and elbow joints have good range of motion with no discomfort.  Tenderness and synovitis over the ulnar aspect of the left wrist.  No tenderness or synovitis over the MCP or PIP joints.  Complete fist formation bilaterally.  Hip joints have good range of motion with no groin pain.  No tenderness over trochanteric bursa bilaterally.  Warmth and large effusions noted in both knees, right greater than left.  Ankle joints have good range of motion with no tenderness or joint swelling.  CDAI Exam: CDAI Score: 7.2  Patient Global: 6 mm; Provider Global: 6 mm Swollen: 3 ; Tender: 3  Joint Exam 12/29/2022      Right  Left  Wrist     Swollen Tender  Knee  Swollen Tender  Swollen Tender     Investigation: No additional findings.  Imaging: No results found.  Recent Labs: Lab Results  Component Value Date   WBC 8.4 10/27/2022   HGB 15.4 10/27/2022   PLT 350 10/27/2022   NA 142 10/27/2022   K 4.5 10/27/2022   CL 107 10/27/2022   CO2 25 10/27/2022    GLUCOSE 107 (H) 10/27/2022   BUN 10 10/27/2022   CREATININE 0.62 10/27/2022   BILITOT 0.7 10/27/2022   ALKPHOS 59 03/23/2017   AST 16 10/27/2022   ALT 15 10/27/2022   PROT 7.2 10/27/2022   ALBUMIN 4.3 03/23/2017   CALCIUM 9.6 10/27/2022   GFRAA 120 02/06/2021   QFTBGOLD NEGATIVE 08/23/2017   QFTBGOLDPLUS NEGATIVE 05/07/2022    Speciality Comments: No specialty comments available.  Procedures:  No procedures performed Allergies: Patient has no known allergies.   Assessment / Plan:     Visit Diagnoses: Rheumatoid arthritis of multiple sites without rheumatoid factor (HCC) - RF, -CCP, -ANA, seronegative: Patient presents today with warmth and large effusions in both knee joints, right greater than left.  She also has tenderness and synovitis on the ulnar aspect of the left wrist.  She started to have an unprovoked flare on Friday to the point that by Saturday she was having difficulty ambulating.  She took ibuprofen 800 mg every 6-8 hours for symptomatic relief this weekend.  Her symptoms have gradually started to improve.  She recently switched from subcutaneous Simponi to [place].  Her first Simponi Aria infusion was administered on 12/11/2022.  She tolerated the infusion without any side effects.  Her next infusion is scheduled for next week.  She remains on Rasuvo 20 mg subcutaneous injections once weekly along with folic acid 2 mg daily.  A refill of Rasuvo sent to the pharmacy today.  According to the patient she has not missed any doses of Rasuvo recently.  Different treatment options were discussed today to manage her current flare.  She had bilateral knee joint cortisone injections on 10/27/2022.  She requested a prednisone taper.  A prednisone taper starting at 20 mg tapering by 5 mg every 4 days was sent to the pharmacy.  She was advised to avoid the use of NSAIDs while taking prednisone and to take prednisone first thing in the morning with food.  If her symptoms persist or worsen  she can return for a joint aspiration and cortisone injection in the future if needed.  For now she will remain on Simponi Aria IV infusions and Rasuvo as prescribed.  She will continue to require close monitoring.  If she continues to have recurrent flares we will need to discuss other treatment options at her follow-up visit.  She will follow-up in the office in 2 to 3 months or sooner if needed. - Plan: Methotrexate, PF, (RASUVO) 20 MG/0.4ML SOAJ  High risk medication use - Switched from sq to IV simponi aria.  Rasuvo 20 mg sq injections every 7 days and folic acid 1 mg 2 tablets daily CBC and CMP were drawn on 10/27/2022.  She will be having updated lab work with her next infusion. TB Gold negative on 05/07/2022. No recent or recurrent infections.  Discussed the importance of holding Simponi Aria and Rasuvo if she develops signs or symptoms of an infection and to resume once the infection has completely cleared.  Bilateral knee effusions: She had bilateral knee joint cortisone injections on 10/27/2022.  Patient presents today with warmth and large effusions in both knees, right greater than left.  Discussed that it is too soon for repeat cortisone injection at this time.  Patient requested a prednisone taper to be sent to the pharmacy today.  A prescription for prednisone 20 mg tapering by 5 mg every 4 days was sent to the pharmacy.  Instructions were provided.  She was advised to notify us if her symptoms persist or worsen at which time she can return for an aspiration and cortisone injection.  Other medical conditions are listed as follows:  Vitamin D deficiency: She is taking vitamin D 1000 units daily.  History of tubal ligation  Arnold-Chiari malformation ([place])  Dyslipidemia   Orders: No orders of the defined types were placed in this encounter.  Meds ordered this encounter  Medications   predniSONE (DELTASONE) 5 MG tablet    Sig: Take 4 tablets by mouth daily x4 days, 3 tables daily  x4days, 2 tablets daily x4 days, 1 tablet daily x4 days.    Dispense:  40 tablet    Refill:  0   Methotrexate, PF, (RASUVO) 20 MG/0.4ML SOAJ  Sig: INJECT 20MG  INTO THE SKIN ONCE WEEKLY    Dispense:  4.8 mL    Refill:  0     Follow-Up Instructions: Return in about 3 months (around 03/31/2023) for Rheumatoid arthritis.   Ofilia Neas, PA-C  Note - This record has been created using Dragon software.  Chart creation errors have been sought, but may not always  have been located. Such creation errors do not reflect on  the standard of medical care.

## 2022-12-29 ENCOUNTER — Telehealth: Payer: Self-pay | Admitting: *Deleted

## 2022-12-29 ENCOUNTER — Ambulatory Visit: Payer: No Typology Code available for payment source | Attending: Physician Assistant | Admitting: Physician Assistant

## 2022-12-29 ENCOUNTER — Encounter: Payer: Self-pay | Admitting: Physician Assistant

## 2022-12-29 VITALS — BP 134/84 | HR 93 | Resp 12 | Ht 61.5 in | Wt 147.0 lb

## 2022-12-29 DIAGNOSIS — M0609 Rheumatoid arthritis without rheumatoid factor, multiple sites: Secondary | ICD-10-CM | POA: Diagnosis not present

## 2022-12-29 DIAGNOSIS — E559 Vitamin D deficiency, unspecified: Secondary | ICD-10-CM

## 2022-12-29 DIAGNOSIS — M25462 Effusion, left knee: Secondary | ICD-10-CM

## 2022-12-29 DIAGNOSIS — M25461 Effusion, right knee: Secondary | ICD-10-CM

## 2022-12-29 DIAGNOSIS — Z9851 Tubal ligation status: Secondary | ICD-10-CM

## 2022-12-29 DIAGNOSIS — E785 Hyperlipidemia, unspecified: Secondary | ICD-10-CM

## 2022-12-29 DIAGNOSIS — Q0700 Arnold-Chiari syndrome without spina bifida or hydrocephalus: Secondary | ICD-10-CM

## 2022-12-29 DIAGNOSIS — Z79899 Other long term (current) drug therapy: Secondary | ICD-10-CM | POA: Diagnosis not present

## 2022-12-29 MED ORDER — PREDNISONE 5 MG PO TABS: ORAL_TABLET | ORAL | 0 refills | Status: DC

## 2022-12-29 MED ORDER — RASUVO 20 MG/0.4ML ~~LOC~~ SOAJ: SUBCUTANEOUS | 0 refills | Status: DC

## 2022-12-29 NOTE — Telephone Encounter (Signed)
[facility] calling to deliver Simponi [phone]. Thank you.

## 2022-12-30 ENCOUNTER — Telehealth: Payer: Self-pay | Admitting: *Deleted

## 2022-12-30 NOTE — Telephone Encounter (Signed)
Received call from [facility] regarding Prior Authorization for Rasuvo 20 mg. They wanted to make sure it has been received.   Call back number is [phone] Reference number: [id number] Store Number [phone]

## 2022-12-30 NOTE — Telephone Encounter (Signed)
Submitted a Prior Authorization request to [facility] for RASUVO via CoverMyMeds. Will update once we receive a response.  Key: [id number]  Knox Saliva, PharmD, MPH, BCPS, CPP Clinical Pharmacist (Rheumatology and Pulmonology)

## 2022-12-31 ENCOUNTER — Encounter: Payer: Self-pay | Admitting: Rheumatology

## 2022-12-31 ENCOUNTER — Other Ambulatory Visit (HOSPITAL_COMMUNITY): Payer: Self-pay

## 2022-12-31 NOTE — Telephone Encounter (Signed)
Received notification from [facility] regarding a prior authorization for RASUVO. Authorization has been APPROVED from 12/30/2022 to 12/30/2023. Approval letter sent to scan center.  Per test claim, copay for 28 days supply is $25  Authorization #  [id number]  Fax returned to [facility] regarding approval  Knox Saliva, PharmD, MPH, BCPS, CPP Clinical Pharmacist (Rheumatology and Pulmonology)

## 2023-01-08 ENCOUNTER — Ambulatory Visit (INDEPENDENT_AMBULATORY_CARE_PROVIDER_SITE_OTHER): Payer: No Typology Code available for payment source

## 2023-01-08 VITALS — BP 119/80 | HR 81 | Temp 97.8°F | Resp 18 | Ht 65.0 in | Wt 147.6 lb

## 2023-01-08 DIAGNOSIS — Z79899 Other long term (current) drug therapy: Secondary | ICD-10-CM

## 2023-01-08 DIAGNOSIS — M0609 Rheumatoid arthritis without rheumatoid factor, multiple sites: Secondary | ICD-10-CM

## 2023-01-08 MED ORDER — ACETAMINOPHEN 325 MG PO TABS
650.0000 mg | ORAL_TABLET | Freq: Once | ORAL | Status: AC
  Administered 2023-01-08: 650 mg via ORAL
  Filled 2023-01-08: qty 2

## 2023-01-08 MED ORDER — SODIUM CHLORIDE 0.9 % IV SOLN
2.0000 mg/kg | Freq: Once | INTRAVENOUS | Status: AC
  Administered 2023-01-08: 133.75 mg via INTRAVENOUS
  Filled 2023-01-08: qty 10.7

## 2023-01-08 MED ORDER — DIPHENHYDRAMINE HCL 25 MG PO CAPS
25.0000 mg | ORAL_CAPSULE | Freq: Once | ORAL | Status: AC
  Administered 2023-01-08: 25 mg via ORAL
  Filled 2023-01-08: qty 1

## 2023-01-08 NOTE — Progress Notes (Signed)
Diagnosis: Rheumatoid Arthritis  Provider:  Marshell Garfinkel MD  Procedure: Infusion  IV Type: Peripheral, IV Location: left wrist  Simponi Aria (Golmumab), Dose: 133.75  Infusion Start Time: [id number]  Infusion Stop Time: [id number]  Post Infusion IV Care: Peripheral IV Discontinued  Discharge: Condition: Good, Destination: Home . AVS Provided  Performed by:  Fraser Din [place]-Burchett, RN

## 2023-03-05 ENCOUNTER — Ambulatory Visit (INDEPENDENT_AMBULATORY_CARE_PROVIDER_SITE_OTHER): Payer: No Typology Code available for payment source | Admitting: *Deleted

## 2023-03-05 VITALS — BP 129/85 | HR 82 | Temp 98.0°F | Resp 16 | Ht 61.0 in | Wt 143.8 lb

## 2023-03-05 DIAGNOSIS — M0609 Rheumatoid arthritis without rheumatoid factor, multiple sites: Secondary | ICD-10-CM

## 2023-03-05 DIAGNOSIS — Z79899 Other long term (current) drug therapy: Secondary | ICD-10-CM | POA: Diagnosis not present

## 2023-03-05 MED ORDER — SODIUM CHLORIDE 0.9 % IV SOLN
2.0000 mg/kg | Freq: Once | INTRAVENOUS | Status: AC
  Administered 2023-03-05: 136.25 mg via INTRAVENOUS
  Filled 2023-03-05: qty 10.9

## 2023-03-05 MED ORDER — ACETAMINOPHEN 325 MG PO TABS
650.0000 mg | ORAL_TABLET | Freq: Once | ORAL | Status: AC
  Administered 2023-03-05: 650 mg via ORAL
  Filled 2023-03-05: qty 2

## 2023-03-05 MED ORDER — DIPHENHYDRAMINE HCL 25 MG PO CAPS
25.0000 mg | ORAL_CAPSULE | Freq: Once | ORAL | Status: AC
  Administered 2023-03-05: 25 mg via ORAL
  Filled 2023-03-05: qty 1

## 2023-03-05 NOTE — Progress Notes (Signed)
Diagnosis: Rheumatoid Arthritis  Provider:  Chilton Greathouse MD  Procedure: IV Infusion  IV Type: Peripheral, IV Location: L Forearm  Simponi Aria (Golmumab), Dose: 136.25mg   Infusion Start Time: 1025 am  Infusion Stop Time: 1058 am  Post Infusion IV Care: Observation period completed and Peripheral IV Discontinued  Discharge: Condition: Good, Destination: Home . AVS Declined  Performed by:  Forrest Moron, RN

## 2023-03-16 NOTE — Progress Notes (Signed)
Office Visit Note  Patient: Sheena Rivera             Date of Birth: [birth year]           MRN: [id number]             PCP: Kerin Salen, PA-C Referring: Kathaleen Bury* Visit Date: 03/29/2023 Occupation: @GUAROCC @  Subjective:  Medication monitoring   History of Present Illness: Sheena Rivera is a 52 y.o. female with history of seronegative rheumatoid arthritis.  Patient remains on IV simponi aria every 8 weeks, Rasuvo 20 mg sq injections every 7 days and folic acid 1 mg 2 tablets daily.  Her last infusion was on 03/05/23.  Overall she has been tolerating combination therapy without any side effects.  She has not had any recent gaps in therapy.  She continues to experience pain in the left breast and both knee joints with states that her inflammation has improved significantly since switching from sq Simponi to IV Simponi Aria.  She currently rates the pain in her knee joints at 3 out of 10.  She denies any other joint pain or joint swelling at this time. She denies any recent or recurrent infections.  She denies any new medical conditions.     Activities of Daily Living:  Patient reports morning stiffness for 30-60 minutes.   Patient Denies nocturnal pain.  Difficulty dressing/grooming: Denies Difficulty climbing stairs: Reports Difficulty getting out of chair: Reports Difficulty using hands for taps, buttons, cutlery, and/or writing: Denies  Review of Systems  Constitutional:  Positive for fatigue.  HENT:  Positive for mouth sores and mouth dryness.        Nose sore  Eyes:  Positive for dryness.  Respiratory:  Negative for shortness of breath.   Cardiovascular:  Negative for chest pain and palpitations.  Gastrointestinal:  Negative for blood in stool, constipation and diarrhea.  Endocrine: Negative for increased urination.  Genitourinary:  Negative for involuntary urination.  Musculoskeletal:  Positive for morning stiffness. Negative for joint pain, gait  problem, joint pain, joint swelling, myalgias, muscle weakness, muscle tenderness and myalgias.  Skin:  Negative for color change, rash, hair loss and sensitivity to sunlight.  Allergic/Immunologic: Negative for susceptible to infections.  Neurological:  Negative for dizziness and headaches.  Hematological:  Negative for swollen glands.  Psychiatric/Behavioral:  Negative for depressed mood and sleep disturbance. The patient is not nervous/anxious.     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) 10/01/2016   High risk medication use 10/01/2016   Vitamin D deficiency 10/01/2016   Plantar fasciitis 10/01/2016    Past Medical History:  Diagnosis Date   Arthritis    Rheumatoid arthritis (HCC)     Family History  Problem Relation Age of Onset   Ovarian cancer Mother    COPD Father    Cancer - Lung Father    Depression Son    Past Surgical History:  Procedure Laterality Date   ABLATION     Social History   Social History Narrative   Not on file   Immunization History  Administered Date(s) Administered   Influenza-Unspecified 08/19/2014   Moderna Sars-Covid-2 Vaccination 11/14/2019, 12/12/2019     Objective: Vital Signs: BP 137/87 (BP Location: Left Arm, Patient Position: Sitting, Cuff Size: Normal)   Pulse (!) 109   Resp 15   Ht 5\' 1"  (1.549 m)  Wt 143 lb 6.4 oz (65 kg)   BMI 27.10 kg/m    Physical Exam Vitals and nursing note reviewed.  Constitutional:      Appearance: She is well-developed.  HENT:     Head: Normocephalic and atraumatic.  Eyes:     Conjunctiva/sclera: Conjunctivae normal.  Cardiovascular:     Rate and Rhythm: Normal rate and regular rhythm.     Heart sounds: Normal heart sounds.  Pulmonary:     Effort: Pulmonary effort is normal.     Breath sounds: Normal breath sounds.  Abdominal:     General: Bowel  sounds are normal.     Palpations: Abdomen is soft.  Musculoskeletal:     Cervical back: Normal range of motion.  Lymphadenopathy:     Cervical: No cervical adenopathy.  Skin:    General: Skin is warm and dry.     Capillary Refill: Capillary refill takes less than 2 seconds.  Neurological:     Mental Status: She is alert and oriented to person, place, and time.  Psychiatric:        Behavior: Behavior normal.      Musculoskeletal Exam: C-spine, thoracic spine, and lumbar spine good ROM.  Shoulder joints have good ROM.  Elbow joints have good ROM with no tenderness or joint swelling.  Limited ROM of the left wrist especially with extension. Some synovial thickening on ulnar aspect of left wrist. Right wrist has good ROM. MCPs, PIPs, and DIPs good ROM with no synovitis.  Complete fist formation bilaterally.  Hip joints have good range of motion with no groin pain.  Knee joints have good range of motion with some discomfort but no effusion noted today.  Ankle joints have good range of motion with no tenderness or synovitis.  CDAI Exam: CDAI Score: 1.6  Patient Global: 3 mm; Provider Global: 3 mm Swollen: 0 ; Tender: 1  Joint Exam 03/29/2023      Right  Left  Wrist      Tender     Investigation: No additional findings.  Imaging: No results found.  Recent Labs: Lab Results  Component Value Date   WBC 8.4 10/27/2022   HGB 15.4 10/27/2022   PLT 350 10/27/2022   NA 142 10/27/2022   K 4.5 10/27/2022   CL 107 10/27/2022   CO2 25 10/27/2022   GLUCOSE 107 (H) 10/27/2022   BUN 10 10/27/2022   CREATININE 0.62 10/27/2022   BILITOT 0.7 10/27/2022   ALKPHOS 59 03/23/2017   AST 16 10/27/2022   ALT 15 10/27/2022   PROT 7.2 10/27/2022   ALBUMIN 4.3 03/23/2017   CALCIUM 9.6 10/27/2022   GFRAA 120 02/06/2021   QFTBGOLD NEGATIVE 08/23/2017   QFTBGOLDPLUS NEGATIVE 05/07/2022    Speciality Comments: Oral MTX = caused nausea (2020)  Procedures:  No procedures performed Allergies:  Patient has no known allergies.   Assessment / Plan:     Visit Diagnoses: Rheumatoid arthritis of multiple sites without rheumatoid factor (HCC) - RF, -CCP, -ANA, seronegative: She has no synovitis on examination today.  She has had intermittent discomfort in the left wrist and both knee joints.  Overall her mobility has improved significantly since switching from sq to IV Simponi Aria.  She remains on Rasuvo 20 mg sq injections once weekly along with folic acid 2 mg daily.  Overall she has been tolerating combination therapy without any side effects.  Her last infusion of Simponi Aria was on 03/05/2023.  She has not been having lab work drawn  with infusions despite standing orders for CBC and CMP remain in place.  Plan to update CBC and CMP today.  TB Gold will be due in July and will hopefully be drawn with her next infusion scheduled in July 2024.  No medication changes will be made at this time.  She was advised to notify us if she develops any signs or symptoms of a flare.  She will follow-up in the office in 5 months or sooner if needed.  High risk medication use - IV Simponi Aria, Rasuvo 20 mg sq injections every 7 days and folic acid 1 mg 2 tablets daily. Previous therapy: Sq Simponi and oral MTX.  CBC and CMP updated on 10/27/2022.  Orders for CBC and CMP released today.  In the future she should have lab work with infusions.  Standing orders remain in place. TB Gold negative on 05/07/2022.  Future order for TB gold will be placed to be drawn with her next infusion. No recent or recurrent infections.  Discussed the importance of holding Simponi Aria and Rasuvo if she develops signs or symptoms of an infection and to resume once the infection is completely cleared. No new medical conditions.  - Plan: CBC with Differential/Platelet, COMPLETE METABOLIC PANEL WITH GFR  Bilateral knee effusions: Improved.  No effusion noted today.  Pain 3/10 currently.  Mobility has improved.   Other medical  conditions are listed as follows:   Vitamin D deficiency:She is taking vitamin D 1000 units daily.  History of tubal ligation  Arnold-Chiari malformation (HCC)  Dyslipidemia  Orders: Orders Placed This Encounter  Procedures   CBC with Differential/Platelet   COMPLETE METABOLIC PANEL WITH GFR   No orders of the defined types were placed in this encounter.   Follow-Up Instructions: Return in about 5 months (around 08/29/2023) for Rheumatoid arthritis.   Gearldine Bienenstock, PA-C  Note - This record has been created using Dragon software.  Chart creation errors have been sought, but may not always  have been located. Such creation errors do not reflect on  the standard of medical care.

## 2023-03-29 ENCOUNTER — Ambulatory Visit: Payer: No Typology Code available for payment source | Attending: Physician Assistant | Admitting: Physician Assistant

## 2023-03-29 ENCOUNTER — Other Ambulatory Visit: Payer: Self-pay | Admitting: Pharmacist

## 2023-03-29 ENCOUNTER — Other Ambulatory Visit: Payer: Self-pay | Admitting: Physician Assistant

## 2023-03-29 ENCOUNTER — Encounter: Payer: Self-pay | Admitting: Physician Assistant

## 2023-03-29 VITALS — BP 137/87 | HR 109 | Resp 15 | Ht 61.0 in | Wt 143.4 lb

## 2023-03-29 DIAGNOSIS — M0609 Rheumatoid arthritis without rheumatoid factor, multiple sites: Secondary | ICD-10-CM

## 2023-03-29 DIAGNOSIS — M25462 Effusion, left knee: Secondary | ICD-10-CM

## 2023-03-29 DIAGNOSIS — M25461 Effusion, right knee: Secondary | ICD-10-CM | POA: Diagnosis not present

## 2023-03-29 DIAGNOSIS — Z79899 Other long term (current) drug therapy: Secondary | ICD-10-CM

## 2023-03-29 DIAGNOSIS — E559 Vitamin D deficiency, unspecified: Secondary | ICD-10-CM

## 2023-03-29 DIAGNOSIS — E785 Hyperlipidemia, unspecified: Secondary | ICD-10-CM

## 2023-03-29 DIAGNOSIS — Q0700 Arnold-Chiari syndrome without spina bifida or hydrocephalus: Secondary | ICD-10-CM

## 2023-03-29 DIAGNOSIS — Z9851 Tubal ligation status: Secondary | ICD-10-CM

## 2023-03-29 LAB — CBC WITH DIFFERENTIAL/PLATELET
HCT: 46 % — ABNORMAL HIGH (ref 35.0–45.0)
MCHC: 34.1 g/dL (ref 32.0–36.0)
Monocytes Relative: 5.6 %
Neutro Abs: 7547 cells/uL (ref 1500–7800)
Platelets: 392 10*3/uL (ref 140–400)
RBC: 5.19 10*6/uL — ABNORMAL HIGH (ref 3.80–5.10)
Total Lymphocyte: 19.1 %

## 2023-03-29 NOTE — Telephone Encounter (Signed)
Last Fill: 12/29/2022  Labs: 03/29/2023 CBC stable. CMP pending.  Next Visit: 09/01/2023  Last Visit: 03/29/2023  DX: Rheumatoid arthritis of multiple sites without rheumatoid factor   Current Dose per office note 03/29/2023: Rasuvo 20 mg sq injections every 7 days   Okay to refill Rasuvo?

## 2023-03-29 NOTE — Progress Notes (Signed)
Email has been sent to Judd Lien and Waneta Martins regarding labs that need to be drawn Simponi Aria infusions.  CBC and CMP every 8 weeks TB gold annually  Will await follow-up  Chesley Mires, PharmD, MPH, BCPS, CPP Clinical Pharmacist (Rheumatology and Pulmonology)

## 2023-03-29 NOTE — Progress Notes (Signed)
CBC stable

## 2023-03-30 LAB — CBC WITH DIFFERENTIAL/PLATELET
Absolute Monocytes: 594 cells/uL (ref 200–950)
Basophils Absolute: 85 cells/uL (ref 0–200)
Basophils Relative: 0.8 %
Eosinophils Absolute: 350 cells/uL (ref 15–500)
Eosinophils Relative: 3.3 %
Hemoglobin: 15.7 g/dL — ABNORMAL HIGH (ref 11.7–15.5)
Lymphs Abs: 2025 cells/uL (ref 850–3900)
MCH: 30.3 pg (ref 27.0–33.0)
MCV: 88.6 fL (ref 80.0–100.0)
MPV: 10.6 fL (ref 7.5–12.5)
Neutrophils Relative %: 71.2 %
RDW: 12.9 % (ref 11.0–15.0)
WBC: 10.6 10*3/uL (ref 3.8–10.8)

## 2023-03-30 LAB — COMPLETE METABOLIC PANEL WITH GFR
AG Ratio: 1.5 (calc) (ref 1.0–2.5)
ALT: 18 U/L (ref 6–29)
AST: 21 U/L (ref 10–35)
Albumin: 4.5 g/dL (ref 3.6–5.1)
Alkaline phosphatase (APISO): 89 U/L (ref 37–153)
BUN: 12 mg/dL (ref 7–25)
CO2: 23 mmol/L (ref 20–32)
Calcium: 9.8 mg/dL (ref 8.6–10.4)
Chloride: 106 mmol/L (ref 98–110)
Creat: 0.67 mg/dL (ref 0.50–1.03)
Globulin: 3.1 g/dL (calc) (ref 1.9–3.7)
Glucose, Bld: 117 mg/dL — ABNORMAL HIGH (ref 65–99)
Potassium: 4.8 mmol/L (ref 3.5–5.3)
Sodium: 140 mmol/L (ref 135–146)
Total Bilirubin: 0.7 mg/dL (ref 0.2–1.2)
Total Protein: 7.6 g/dL (ref 6.1–8.1)
eGFR: 105 mL/min/{1.73_m2} (ref >=60)

## 2023-03-30 NOTE — Progress Notes (Signed)
Glucose is 117. Rest of CMP WNL

## 2023-04-09 ENCOUNTER — Other Ambulatory Visit: Payer: Self-pay | Admitting: Rheumatology

## 2023-04-09 DIAGNOSIS — M0609 Rheumatoid arthritis without rheumatoid factor, multiple sites: Secondary | ICD-10-CM

## 2023-04-09 DIAGNOSIS — Z79899 Other long term (current) drug therapy: Secondary | ICD-10-CM

## 2023-04-09 NOTE — Telephone Encounter (Signed)
Last Fill: 11/20/2022  Labs: 03/29/2023 CBC stable. Glucose is 117. Rest of CMP WNL   TB Gold: 05/07/2022 NEg    Next Visit: 09/01/2023  Last Visit: 03/29/2023  DX: Rheumatoid arthritis of multiple sites without rheumatoid factor   Current Dose per office note 03/29/2023: IV Simponi Aria   Okay to refill Simponi?

## 2023-04-30 ENCOUNTER — Ambulatory Visit: Payer: No Typology Code available for payment source

## 2023-04-30 VITALS — BP 123/86 | HR 80 | Temp 97.9°F | Resp 16 | Ht 61.0 in | Wt 143.6 lb

## 2023-04-30 DIAGNOSIS — Z79899 Other long term (current) drug therapy: Secondary | ICD-10-CM | POA: Diagnosis not present

## 2023-04-30 DIAGNOSIS — M0609 Rheumatoid arthritis without rheumatoid factor, multiple sites: Secondary | ICD-10-CM | POA: Diagnosis not present

## 2023-04-30 MED ORDER — SODIUM CHLORIDE 0.9 % IV SOLN
2.0000 mg/kg | Freq: Once | INTRAVENOUS | Status: AC
  Administered 2023-04-30: 131.25 mg via INTRAVENOUS
  Filled 2023-04-30: qty 10.5

## 2023-04-30 MED ORDER — ACETAMINOPHEN 325 MG PO TABS
650.0000 mg | ORAL_TABLET | Freq: Once | ORAL | Status: AC
  Administered 2023-04-30: 650 mg via ORAL
  Filled 2023-04-30: qty 2

## 2023-04-30 MED ORDER — DIPHENHYDRAMINE HCL 25 MG PO CAPS
25.0000 mg | ORAL_CAPSULE | Freq: Once | ORAL | Status: AC
  Administered 2023-04-30: 25 mg via ORAL
  Filled 2023-04-30: qty 1

## 2023-04-30 NOTE — Progress Notes (Signed)
Diagnosis: Rheumatoid Arthritis  Provider:  Chilton Greathouse MD  Procedure: IV Infusion  IV Type: Peripheral, IV Location: L Forearm  Simponi Aria (Golmumab), Dose: 131.25mg   Infusion Start Time: 1039  Infusion Stop Time: 1110  Post Infusion IV Care: Peripheral IV Discontinued  Discharge: Condition: Good, Destination: Home . AVS Declined  Performed by:  Nat Math, RN

## 2023-06-11 ENCOUNTER — Telehealth: Payer: Self-pay | Admitting: Pharmacist

## 2023-06-11 NOTE — Telephone Encounter (Signed)
Received notification from [facility] regarding a prior authorization for [facility] ARIA. Authorization has been APPROVED from 06/11/23 to 06/10/24. Approval letter sent to scan center.  Patient must continue to fill through [facility]: [phone]   Authorization # [id number]  Chesley Mires, PharmD, MPH, BCPS, CPP Clinical Pharmacist (Rheumatology and Pulmonology)

## 2023-06-11 NOTE — Telephone Encounter (Signed)
Received CMM key for Simponi Aria renewal.  Completed and faxed with clinicals  CMM Key: [id number]  Routing to Kim C since pt receives infusions at [facility]  Chesley Mires, PharmD, MPH, BCPS, CPP Clinical Pharmacist (Rheumatology and Pulmonology)

## 2023-06-25 ENCOUNTER — Ambulatory Visit (INDEPENDENT_AMBULATORY_CARE_PROVIDER_SITE_OTHER): Payer: No Typology Code available for payment source

## 2023-06-25 ENCOUNTER — Other Ambulatory Visit (INDEPENDENT_AMBULATORY_CARE_PROVIDER_SITE_OTHER): Payer: No Typology Code available for payment source

## 2023-06-25 VITALS — BP 119/84 | HR 87 | Temp 97.7°F | Resp 14 | Ht 61.0 in | Wt 141.8 lb

## 2023-06-25 DIAGNOSIS — Z79899 Other long term (current) drug therapy: Secondary | ICD-10-CM | POA: Diagnosis not present

## 2023-06-25 DIAGNOSIS — M0609 Rheumatoid arthritis without rheumatoid factor, multiple sites: Secondary | ICD-10-CM

## 2023-06-25 DIAGNOSIS — Z111 Encounter for screening for respiratory tuberculosis: Secondary | ICD-10-CM

## 2023-06-25 LAB — CBC WITH DIFFERENTIAL/PLATELET
Basophils Absolute: 0.1 10*3/uL (ref 0.0–0.1)
Basophils Relative: 1 % (ref 0.0–3.0)
Eosinophils Absolute: 0.3 10*3/uL (ref 0.0–0.7)
Eosinophils Relative: 3.4 % (ref 0.0–5.0)
HCT: 42.7 % (ref 36.0–46.0)
Hemoglobin: 14.5 g/dL (ref 12.0–15.0)
Lymphocytes Relative: 24.6 % (ref 12.0–46.0)
Lymphs Abs: 2.2 10*3/uL (ref 0.7–4.0)
MCHC: 33.9 g/dL (ref 30.0–36.0)
MCV: 86.5 fl (ref 78.0–100.0)
Monocytes Absolute: 0.4 10*3/uL (ref 0.1–1.0)
Monocytes Relative: 4.9 % (ref 3.0–12.0)
Neutro Abs: 5.8 10*3/uL (ref 1.4–7.7)
Neutrophils Relative %: 66.1 % (ref 43.0–77.0)
Platelets: 352 10*3/uL (ref 150.0–400.0)
RBC: 4.94 Mil/uL (ref 3.87–5.11)
RDW: 12.9 % (ref 11.5–15.5)
WBC: 8.9 10*3/uL (ref 4.0–10.5)

## 2023-06-25 LAB — COMPREHENSIVE METABOLIC PANEL
ALT: 16 U/L (ref 0–35)
AST: 21 U/L (ref 0–37)
Albumin: 4.2 g/dL (ref 3.5–5.2)
Alkaline Phosphatase: 91 U/L (ref 39–117)
BUN: 11 mg/dL (ref 6–23)
CO2: 26 meq/L (ref 19–32)
Calcium: 9.6 mg/dL (ref 8.4–10.5)
Chloride: 105 meq/L (ref 96–112)
Creatinine, Ser: 0.64 mg/dL (ref 0.40–1.20)
GFR: 101.64 mL/min (ref >60.00)
Glucose, Bld: 114 mg/dL — ABNORMAL HIGH (ref 70–99)
Potassium: 3.8 meq/L (ref 3.5–5.1)
Sodium: 139 meq/L (ref 135–145)
Total Bilirubin: 0.8 mg/dL (ref 0.2–1.2)
Total Protein: 7.5 g/dL (ref 6.0–8.3)

## 2023-06-25 MED ORDER — ACETAMINOPHEN 325 MG PO TABS
650.0000 mg | ORAL_TABLET | Freq: Once | ORAL | Status: AC
  Administered 2023-06-25: 650 mg via ORAL
  Filled 2023-06-25: qty 2

## 2023-06-25 MED ORDER — SODIUM CHLORIDE 0.9 % IV SOLN
2.0000 mg/kg | Freq: Once | INTRAVENOUS | Status: AC
  Administered 2023-06-25: 131.25 mg via INTRAVENOUS
  Filled 2023-06-25: qty 10.5

## 2023-06-25 MED ORDER — DIPHENHYDRAMINE HCL 25 MG PO CAPS
25.0000 mg | ORAL_CAPSULE | Freq: Once | ORAL | Status: AC
  Administered 2023-06-25: 25 mg via ORAL
  Filled 2023-06-25: qty 1

## 2023-06-25 NOTE — Progress Notes (Signed)
Diagnosis: Rheumatoid Arthritis  Provider:  Chilton Greathouse MD  Procedure: IV Infusion  IV Type: Peripheral, IV Location: L Forearm  Simponi Aria (Golmumab), Dose: 131.25mg   Infusion Start Time: 0943  Infusion Stop Time: 1021  Post Infusion IV Care: Patient declined observation and Peripheral IV Discontinued  Discharge: Condition: Good, Destination: Home . AVS Declined  Performed by:  Adriana Mccallum, RN

## 2023-06-27 LAB — QUANTIFERON-TB GOLD PLUS
Mitogen-NIL: 8.22 [IU]/mL
NIL: 0.02 [IU]/mL
QuantiFERON-TB Gold Plus: NEGATIVE
TB1-NIL: 0 [IU]/mL
TB2-NIL: 0 [IU]/mL

## 2023-07-03 ENCOUNTER — Other Ambulatory Visit: Payer: Self-pay | Admitting: Physician Assistant

## 2023-07-03 DIAGNOSIS — M0609 Rheumatoid arthritis without rheumatoid factor, multiple sites: Secondary | ICD-10-CM

## 2023-07-05 NOTE — Telephone Encounter (Signed)
Last Fill: 03/29/2023  Labs: 06/25/2023 Glucose 114,   Next Visit: 09/01/2023  Last Visit: 03/29/2023  DX: Rheumatoid arthritis of multiple sites without rheumatoid factor   Current Dose per office note 03/29/2023: Rasuvo 20 mg sq injections every 7 days   Okay to refill Rasuvo?

## 2023-07-30 ENCOUNTER — Other Ambulatory Visit: Payer: Self-pay | Admitting: Rheumatology

## 2023-07-30 NOTE — Telephone Encounter (Signed)
Last Fill: 08/03/2022  Next Visit: 09/01/2023  Last Visit: 03/29/2023  Dx: Rheumatoid arthritis of multiple sites without rheumatoid factor   Current Dose per office note on 03/29/2023: folic acid 1 mg 2 tablets daily   Okay to refill Folic Acid?

## 2023-08-09 ENCOUNTER — Telehealth: Payer: Self-pay

## 2023-08-09 NOTE — Telephone Encounter (Signed)
Received a fax from [facility] stating they have been trying to contact the patient regarding her Simponi. Attempted to contact the patient and left a message for the patient to contact [facility] at [phone] regarding her Simponi.

## 2023-08-10 ENCOUNTER — Telehealth: Payer: Self-pay | Admitting: *Deleted

## 2023-08-10 NOTE — Telephone Encounter (Signed)
Received a call from [place] with [employer]. They are trying to set up shipment of patient' medication. Requesting a call back at [phone].

## 2023-08-20 ENCOUNTER — Ambulatory Visit (INDEPENDENT_AMBULATORY_CARE_PROVIDER_SITE_OTHER): Payer: No Typology Code available for payment source

## 2023-08-20 VITALS — BP 129/87 | HR 82 | Temp 97.4°F | Resp 18 | Ht 61.0 in | Wt 144.4 lb

## 2023-08-20 DIAGNOSIS — M0609 Rheumatoid arthritis without rheumatoid factor, multiple sites: Secondary | ICD-10-CM

## 2023-08-20 DIAGNOSIS — Z79899 Other long term (current) drug therapy: Secondary | ICD-10-CM

## 2023-08-20 MED ORDER — ACETAMINOPHEN 325 MG PO TABS
650.0000 mg | ORAL_TABLET | Freq: Once | ORAL | Status: AC
  Administered 2023-08-20: 650 mg via ORAL
  Filled 2023-08-20: qty 2

## 2023-08-20 MED ORDER — DIPHENHYDRAMINE HCL 25 MG PO CAPS
25.0000 mg | ORAL_CAPSULE | Freq: Once | ORAL | Status: AC
  Administered 2023-08-20: 25 mg via ORAL
  Filled 2023-08-20: qty 1

## 2023-08-20 MED ORDER — SODIUM CHLORIDE 0.9 % IV SOLN
2.0000 mg/kg | Freq: Once | INTRAVENOUS | Status: AC
  Administered 2023-08-20: 131.25 mg via INTRAVENOUS
  Filled 2023-08-20: qty 10.5

## 2023-08-20 NOTE — Progress Notes (Signed)
Office Visit Note  Patient: Sheena Rivera             Date of Birth: 03-21-1971           MRN: [id number]             PCP: Kerin Salen, PA-C Referring: Kathaleen Bury* Visit Date: 09/01/2023 Occupation: @GUAROCC @  Subjective:  Medication management  History of Present Illness: Sheena Rivera is a 52 y.o. female with seronegative rheumatoid arthritis.  She states she had a flare of rheumatoid arthritis on the day of Simponi Aria infusion which lasted for 24 hours and then resolved.  She has been getting Simponi Aria IV every 8 weeks without any interruption.  She is also on Rasuvo 20 mg subcu every 7 days with folic acid 2 mg daily.  She denies any side effects from the medications.  She states at the time of flare she was having pain in her left elbow, left hand and her knee joints.  The symptoms quickly improved without any prednisone.  She denies any joint pain or swelling today.    Activities of Daily Living:  Patient reports morning stiffness for 20 minutes.   Patient Denies nocturnal pain.  Difficulty dressing/grooming: Denies Difficulty climbing stairs: Reports Difficulty getting out of chair: Reports Difficulty using hands for taps, buttons, cutlery, and/or writing: Reports  Review of Systems  Constitutional:  Positive for fatigue.  HENT:  Positive for mouth dryness. Negative for mouth sores.   Eyes:  Positive for dryness.  Respiratory:  Negative for shortness of breath.   Cardiovascular:  Negative for chest pain and palpitations.  Gastrointestinal:  Negative for blood in stool, constipation and diarrhea.  Endocrine: Negative for increased urination.  Genitourinary:  Negative for involuntary urination.  Musculoskeletal:  Positive for morning stiffness. Negative for joint pain, gait problem, joint pain, joint swelling, myalgias, muscle weakness, muscle tenderness and myalgias.  Skin:  Negative for color change, rash, hair loss and sensitivity to  sunlight.  Allergic/Immunologic: Negative for susceptible to infections.  Neurological:  Negative for dizziness and headaches.  Hematological:  Negative for swollen glands.  Psychiatric/Behavioral:  Positive for sleep disturbance. Negative for depressed mood. The patient is not nervous/anxious.     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) 10/01/2016   High risk medication use 10/01/2016   Vitamin D deficiency 10/01/2016   Plantar fasciitis 10/01/2016    Past Medical History:  Diagnosis Date   Arthritis    Rheumatoid arthritis (HCC)     Family History  Problem Relation Age of Onset   Ovarian cancer Mother    COPD Father    Cancer - Lung Father    Depression Son    Past Surgical History:  Procedure Laterality Date   ABLATION     Social History   Social History Narrative   Not on file   Immunization History  Administered Date(s) Administered   Influenza-Unspecified 08/19/2014   Moderna Sars-Covid-2 Vaccination 11/14/2019, 12/12/2019     Objective: Vital Signs: BP 132/89 (BP Location: Left Arm, Patient Position: Sitting, Cuff Size: Normal)   Pulse 97   Resp 15   Ht 5\' 1"  (1.549 m)   Wt 143 lb 12.8 oz (65.2 kg)   BMI 27.17 kg/m    Physical Exam Vitals and nursing note reviewed.  Constitutional:      Appearance: She is  well-developed.  HENT:     Head: Normocephalic and atraumatic.  Eyes:     Conjunctiva/sclera: Conjunctivae normal.  Cardiovascular:     Rate and Rhythm: Normal rate and regular rhythm.     Heart sounds: Normal heart sounds.  Pulmonary:     Effort: Pulmonary effort is normal.     Breath sounds: Normal breath sounds.  Abdominal:     General: Bowel sounds are normal.     Palpations: Abdomen is soft.  Musculoskeletal:     Cervical back: Normal range of motion.  Lymphadenopathy:     Cervical: No cervical  adenopathy.  Skin:    General: Skin is warm and dry.     Capillary Refill: Capillary refill takes less than 2 seconds.  Neurological:     Mental Status: She is alert and oriented to person, place, and time.  Psychiatric:        Behavior: Behavior normal.      Musculoskeletal Exam: Cervical, thoracic and lumbar spine 1 good range of motion.  Shoulder joints, elbow joints, wrist joints, MCPs PIPs and DIPs with good range of motion with no synovitis.  Hip joints, knee joints, ankles, MTPs and PIPs were in good range of motion with no synovitis.  CDAI Exam: CDAI Score: -- Patient Global: 0 / 100; Provider Global: 0 / 100 Swollen: --; Tender: -- Joint Exam 09/01/2023   No joint exam has been documented for this visit   There is currently no information documented on the homunculus. Go to the Rheumatology activity and complete the homunculus joint exam.  Investigation: No additional findings.  Imaging: No results found.  Recent Labs: Lab Results  Component Value Date   WBC 8.7 08/20/2023   HGB 14.4 08/20/2023   PLT 370 08/20/2023   NA 137 08/20/2023   K 4.1 08/20/2023   CL 105 08/20/2023   CO2 24 08/20/2023   GLUCOSE 111 (H) 08/20/2023   BUN 14 08/20/2023   CREATININE 0.66 08/20/2023   BILITOT 0.5 08/20/2023   ALKPHOS 91 06/25/2023   AST 21 08/20/2023   ALT 13 08/20/2023   PROT 7.3 08/20/2023   ALBUMIN 4.2 06/25/2023   CALCIUM 9.7 08/20/2023   GFRAA 120 02/06/2021   QFTBGOLD NEGATIVE 08/23/2017   QFTBGOLDPLUS NEGATIVE 06/25/2023    Speciality Comments: Oral MTX = caused nausea (2020)  Procedures:  No procedures performed Allergies: Patient has no known allergies.   Assessment / Plan:     Visit Diagnoses: Rheumatoid arthritis of multiple sites without rheumatoid factor (HCC) - RF, -CCP, -ANA, seronegative: Patient reports having a recent flare on November 1 the day she had Simponi Aria infusion.  She states the symptoms lasted for about 24 hours then resolved.   She had flare in her left elbow, left hand and her knee joints.  She did not require any prednisone.  She denies any interruption in the treatment.  She has been on IV Simponi infusions every 2 months and she is on Rasuvo 20 mg subcu weekly 9.  She had no synovitis on examination today.  High risk medication use - IV Simponi Aria, Rasuvo 20 mg sq injections every 7 days and folic acid 1 mg 2 tablets daily.  November first 2024 CBC and CMP were normal.  TB Gold was negative on June 25, 2023.  Patient was advised to get labs every 3 months.  Information on immunization was placed in the AVS.  She was advised to hold Simponi Aria infusions and Rasuvo if she develops  an infection resume after the infection resolves.  Annual skin examination to screen for skin cancer was advised.  Bilateral knee effusions-she has not had knee joint effusion in a while.  Vitamin D deficiency-I do not have a recent vitamin D value.  Her last vitamin D was low at 37 on October 27, 2022.  She was advised to take vitamin D on a regular basis.  History of tubal ligation  Arnold-Chiari malformation (HCC)  Dyslipidemia  Osteoporosis screening-I will schedule DEXA scan.  Postmenopausal  Orders: No orders of the defined types were placed in this encounter.  No orders of the defined types were placed in this encounter.    Follow-Up Instructions: Return in about 5 months (around 01/30/2024) for Rheumatoid arthritis.   Pollyann Savoy, MD  Note - This record has been created using Animal nutritionist.  Chart creation errors have been sought, but may not always  have been located. Such creation errors do not reflect on  the standard of medical care.

## 2023-08-20 NOTE — Progress Notes (Signed)
Diagnosis: Rheumatoid Arthritis  Provider:  Chilton Greathouse MD  Procedure: IV Infusion  IV Type: Peripheral, IV Location: L Forearm  Simponi Aria (Golmumab), Dose: 131.25 mg  Infusion Start Time: 0928  Infusion Stop Time: 1002  Post Infusion IV Care: Patient declined observation and Peripheral IV Discontinued  Discharge: Condition: Good, Destination: Home . AVS Declined  Performed by:  Wyvonne Lenz, RN

## 2023-08-21 LAB — COMPREHENSIVE METABOLIC PANEL
AG Ratio: 1.4 (calc) (ref 1.0–2.5)
ALT: 13 U/L (ref 6–29)
AST: 21 U/L (ref 10–35)
Albumin: 4.3 g/dL (ref 3.6–5.1)
Alkaline phosphatase (APISO): 80 U/L (ref 37–153)
BUN: 14 mg/dL (ref 7–25)
CO2: 24 mmol/L (ref 20–32)
Calcium: 9.7 mg/dL (ref 8.6–10.4)
Chloride: 105 mmol/L (ref 98–110)
Creat: 0.66 mg/dL (ref 0.50–1.03)
Globulin: 3 g/dL (ref 1.9–3.7)
Glucose, Bld: 111 mg/dL — ABNORMAL HIGH (ref 65–99)
Potassium: 4.1 mmol/L (ref 3.5–5.3)
Sodium: 137 mmol/L (ref 135–146)
Total Bilirubin: 0.5 mg/dL (ref 0.2–1.2)
Total Protein: 7.3 g/dL (ref 6.1–8.1)

## 2023-08-21 LAB — CBC WITH DIFFERENTIAL/PLATELET
Absolute Lymphocytes: 2427 {cells}/uL (ref 850–3900)
Absolute Monocytes: 418 {cells}/uL (ref 200–950)
Basophils Absolute: 87 {cells}/uL (ref 0–200)
Basophils Relative: 1 %
Eosinophils Absolute: 313 {cells}/uL (ref 15–500)
Eosinophils Relative: 3.6 %
HCT: 43.7 % (ref 35.0–45.0)
Hemoglobin: 14.4 g/dL (ref 11.7–15.5)
MCH: 29.4 pg (ref 27.0–33.0)
MCHC: 33 g/dL (ref 32.0–36.0)
MCV: 89.2 fL (ref 80.0–100.0)
MPV: 10.4 fL (ref 7.5–12.5)
Monocytes Relative: 4.8 %
Neutro Abs: 5455 {cells}/uL (ref 1500–7800)
Neutrophils Relative %: 62.7 %
Platelets: 370 10*3/uL (ref 140–400)
RBC: 4.9 10*6/uL (ref 3.80–5.10)
RDW: 12.6 % (ref 11.0–15.0)
Total Lymphocyte: 27.9 %
WBC: 8.7 10*3/uL (ref 3.8–10.8)

## 2023-08-21 LAB — SPECIMEN COMPROMISED

## 2023-09-01 ENCOUNTER — Ambulatory Visit: Payer: No Typology Code available for payment source | Attending: Rheumatology | Admitting: Rheumatology

## 2023-09-01 ENCOUNTER — Encounter: Payer: Self-pay | Admitting: Rheumatology

## 2023-09-01 VITALS — BP 132/89 | HR 97 | Resp 15 | Ht 61.0 in | Wt 143.8 lb

## 2023-09-01 DIAGNOSIS — Z780 Asymptomatic menopausal state: Secondary | ICD-10-CM

## 2023-09-01 DIAGNOSIS — E559 Vitamin D deficiency, unspecified: Secondary | ICD-10-CM | POA: Diagnosis not present

## 2023-09-01 DIAGNOSIS — E785 Hyperlipidemia, unspecified: Secondary | ICD-10-CM

## 2023-09-01 DIAGNOSIS — Z13820 Encounter for screening for osteoporosis: Secondary | ICD-10-CM

## 2023-09-01 DIAGNOSIS — M25461 Effusion, right knee: Secondary | ICD-10-CM

## 2023-09-01 DIAGNOSIS — Z79899 Other long term (current) drug therapy: Secondary | ICD-10-CM

## 2023-09-01 DIAGNOSIS — M25462 Effusion, left knee: Secondary | ICD-10-CM

## 2023-09-01 DIAGNOSIS — Z9851 Tubal ligation status: Secondary | ICD-10-CM

## 2023-09-01 DIAGNOSIS — Q0700 Arnold-Chiari syndrome without spina bifida or hydrocephalus: Secondary | ICD-10-CM

## 2023-09-01 DIAGNOSIS — M0609 Rheumatoid arthritis without rheumatoid factor, multiple sites: Secondary | ICD-10-CM

## 2023-09-01 NOTE — Patient Instructions (Signed)
Vaccines You are taking a medication(s) that can suppress your immune system.  The following immunizations are recommended: Flu annually Covid-19  RSV Td/Tdap (tetanus, diphtheria, pertussis) every 10 years Pneumonia (Prevnar 15 then Pneumovax 23 at least 1 year apart.  Alternatively, can take Prevnar 20 without needing additional dose) Shingrix: 2 doses from 4 weeks to 6 months apart  Please check with your PCP to make sure you are up to date.   If you have signs or symptoms of an infection or start antibiotics: First, call your PCP for workup of your infection. Hold your medication through the infection, until you complete your antibiotics, and until symptoms resolve if you take the following: Injectable medication (Actemra, Benlysta, Cimzia, Cosentyx, Enbrel, Humira, Kevzara, Orencia, Remicade, Simponi, Stelara, Taltz, Tremfya) Methotrexate Leflunomide (Arava) Mycophenolate (Cellcept) Harriette Ohara, Olumiant, or Rinvoq  Please get an annual skin examination to screen for skin cancer while you are on Simponi.

## 2023-09-01 NOTE — Addendum Note (Signed)
Addended by: Ellen Henri on: 09/01/2023 04:08 PM   Modules accepted: Orders

## 2023-09-08 LAB — HM DEXA SCAN

## 2023-09-14 ENCOUNTER — Telehealth: Payer: Self-pay | Admitting: *Deleted

## 2023-09-14 NOTE — Telephone Encounter (Signed)
Attempted to contact the patient and left message for patient to call the office.  

## 2023-09-14 NOTE — Telephone Encounter (Signed)
Received DEXA results from [facility].  Date of Scan: 09/08/2023  Lowest T-score: -1.9  BMD:0.705  Lowest site measured: Right Total hip  DX: Osteopenia  Significant changes in BMD and site measured (5% and above):n/a  Current Regimen:Vitamin D  Recommendation:Recommend Calcium, Vitamin D, Resistive Exercises and repeat DEXA in 2 years.   Reviewed by:Sherron Ales, PA-C  Next Appointment:  01/31/2024

## 2023-09-14 NOTE — Telephone Encounter (Signed)
I called patient, patient verbalized understanding.

## 2023-10-02 ENCOUNTER — Other Ambulatory Visit: Payer: Self-pay | Admitting: Rheumatology

## 2023-10-02 DIAGNOSIS — M0609 Rheumatoid arthritis without rheumatoid factor, multiple sites: Secondary | ICD-10-CM

## 2023-10-04 NOTE — Telephone Encounter (Signed)
Last Fill: 07/05/2023  Labs: 08/20/2023 Glucose 111  Next Visit: 01/31/2024  Last Visit: 09/01/2023  DX:  Rheumatoid arthritis of multiple sites without rheumatoid factor    Current Dose per office note 09/01/2023: Rasuvo 20 mg sq injections every 7 days   Okay to refill Rasuvo?

## 2023-10-05 ENCOUNTER — Encounter: Payer: Self-pay | Admitting: Rheumatology

## 2023-10-08 ENCOUNTER — Encounter: Payer: Self-pay | Admitting: Rheumatology

## 2023-10-15 ENCOUNTER — Ambulatory Visit (INDEPENDENT_AMBULATORY_CARE_PROVIDER_SITE_OTHER): Payer: No Typology Code available for payment source

## 2023-10-15 VITALS — BP 136/83 | HR 85 | Temp 98.2°F | Resp 18 | Ht 61.0 in | Wt 145.8 lb

## 2023-10-15 DIAGNOSIS — M0609 Rheumatoid arthritis without rheumatoid factor, multiple sites: Secondary | ICD-10-CM

## 2023-10-15 DIAGNOSIS — Z79899 Other long term (current) drug therapy: Secondary | ICD-10-CM

## 2023-10-15 MED ORDER — DIPHENHYDRAMINE HCL 25 MG PO CAPS
25.0000 mg | ORAL_CAPSULE | Freq: Once | ORAL | Status: AC
  Administered 2023-10-15: 25 mg via ORAL
  Filled 2023-10-15: qty 1

## 2023-10-15 MED ORDER — SODIUM CHLORIDE 0.9 % IV SOLN
2.0000 mg/kg | Freq: Once | INTRAVENOUS | Status: AC
  Administered 2023-10-15: 127.5 mg via INTRAVENOUS
  Filled 2023-10-15: qty 10.2

## 2023-10-15 MED ORDER — ACETAMINOPHEN 325 MG PO TABS
650.0000 mg | ORAL_TABLET | Freq: Once | ORAL | Status: AC
  Administered 2023-10-15: 650 mg via ORAL
  Filled 2023-10-15: qty 2

## 2023-10-15 NOTE — Progress Notes (Signed)
Diagnosis: Rheumatoid Arthritis  Provider:  Chilton Greathouse MD  Procedure: IV Infusion  IV Type: Peripheral, IV Location: R Forearm  Simponi Aria (Golimumab), Dose: 127.5 mg  Infusion Start Time: 0933  Infusion Stop Time: 1005  Post Infusion IV Care: Patient declined observation and Peripheral IV Discontinued  Discharge: Condition: Good, Destination: Home . AVS Declined  Performed by:  Fritzi Mandes, RN

## 2023-10-20 DIAGNOSIS — L900 Lichen sclerosus et atrophicus: Secondary | ICD-10-CM

## 2023-10-20 HISTORY — DX: Lichen sclerosus et atrophicus: L90.0

## 2023-12-10 ENCOUNTER — Ambulatory Visit: Payer: No Typology Code available for payment source

## 2023-12-10 ENCOUNTER — Other Ambulatory Visit: Payer: Self-pay

## 2023-12-10 VITALS — BP 136/85 | HR 84 | Temp 97.9°F | Resp 16 | Ht 61.0 in | Wt 146.4 lb

## 2023-12-10 DIAGNOSIS — M0609 Rheumatoid arthritis without rheumatoid factor, multiple sites: Secondary | ICD-10-CM | POA: Diagnosis not present

## 2023-12-10 DIAGNOSIS — Z79899 Other long term (current) drug therapy: Secondary | ICD-10-CM

## 2023-12-10 MED ORDER — DIPHENHYDRAMINE HCL 25 MG PO CAPS
25.0000 mg | ORAL_CAPSULE | Freq: Once | ORAL | Status: AC
  Administered 2023-12-10: 25 mg via ORAL
  Filled 2023-12-10: qty 1

## 2023-12-10 MED ORDER — SODIUM CHLORIDE 0.9 % IV SOLN
2.0000 mg/kg | Freq: Once | INTRAVENOUS | Status: AC
  Administered 2023-12-10: 132.5 mg via INTRAVENOUS
  Filled 2023-12-10: qty 10.6

## 2023-12-10 MED ORDER — ACETAMINOPHEN 325 MG PO TABS
650.0000 mg | ORAL_TABLET | Freq: Once | ORAL | Status: AC
  Administered 2023-12-10: 650 mg via ORAL
  Filled 2023-12-10: qty 2

## 2023-12-10 NOTE — Progress Notes (Signed)
Diagnosis: Rheumatoid Arthritis  Provider:  Chilton Greathouse MD  Procedure: IV Infusion  IV Type: Peripheral, IV Location: R Forearm  Simponi Aria (Golimumab), Dose: 132.5 mg  Infusion Start Time: 0926  Infusion Stop Time: 0958  Post Infusion IV Care: Patient declined observation and Peripheral IV Discontinued  Discharge: Condition: Stable, Destination: Home . AVS Declined  Performed by:  Wyvonne Lenz, RN

## 2023-12-11 LAB — COMPLETE METABOLIC PANEL WITH GFR
AG Ratio: 1.6 (calc) (ref 1.0–2.5)
ALT: 18 U/L (ref 6–29)
AST: 21 U/L (ref 10–35)
Albumin: 4.1 g/dL (ref 3.6–5.1)
Alkaline phosphatase (APISO): 95 U/L (ref 37–153)
BUN: 10 mg/dL (ref 7–25)
CO2: 23 mmol/L (ref 20–32)
Calcium: 9.1 mg/dL (ref 8.6–10.4)
Chloride: 106 mmol/L (ref 98–110)
Creat: 0.65 mg/dL (ref 0.50–1.03)
Globulin: 2.6 g/dL (ref 1.9–3.7)
Glucose, Bld: 106 mg/dL — ABNORMAL HIGH (ref 65–99)
Potassium: 4.3 mmol/L (ref 3.5–5.3)
Sodium: 139 mmol/L (ref 135–146)
Total Bilirubin: 0.9 mg/dL (ref 0.2–1.2)
Total Protein: 6.7 g/dL (ref 6.1–8.1)
eGFR: 106 mL/min/{1.73_m2} (ref >=60)

## 2023-12-11 LAB — CBC WITH DIFFERENTIAL/PLATELET
Absolute Lymphocytes: 2303 {cells}/uL (ref 850–3900)
Absolute Monocytes: 549 {cells}/uL (ref 200–950)
Basophils Absolute: 69 {cells}/uL (ref 0–200)
Basophils Relative: 0.7 %
Eosinophils Absolute: 627 {cells}/uL — ABNORMAL HIGH (ref 15–500)
Eosinophils Relative: 6.4 %
HCT: 40.9 % (ref 35.0–45.0)
Hemoglobin: 13.9 g/dL (ref 11.7–15.5)
MCH: 29.6 pg (ref 27.0–33.0)
MCHC: 34 g/dL (ref 32.0–36.0)
MCV: 87.2 fL (ref 80.0–100.0)
MPV: 11.1 fL (ref 7.5–12.5)
Monocytes Relative: 5.6 %
Neutro Abs: 6252 {cells}/uL (ref 1500–7800)
Neutrophils Relative %: 63.8 %
Platelets: 339 10*3/uL (ref 140–400)
RBC: 4.69 10*6/uL (ref 3.80–5.10)
RDW: 12.6 % (ref 11.0–15.0)
Total Lymphocyte: 23.5 %
WBC: 9.8 10*3/uL (ref 3.8–10.8)

## 2023-12-12 NOTE — Progress Notes (Signed)
 CBC and CMP are stable.

## 2024-01-03 ENCOUNTER — Other Ambulatory Visit: Payer: Self-pay | Admitting: Physician Assistant

## 2024-01-03 DIAGNOSIS — M0609 Rheumatoid arthritis without rheumatoid factor, multiple sites: Secondary | ICD-10-CM

## 2024-01-03 NOTE — Telephone Encounter (Signed)
 Last Fill: 10/04/2023  Labs: 12/10/2023 CBC and CMP are stable.   Next Visit: 01/31/2024  Last Visit: 09/01/2023  DX:  Rheumatoid arthritis of multiple sites without rheumatoid factor   Current Dose per office note 09/01/2023: Rasuvo 20 mg sq injections every 7 days   Okay to refill Rasuvo?

## 2024-01-06 ENCOUNTER — Telehealth: Payer: Self-pay | Admitting: *Deleted

## 2024-01-06 NOTE — Telephone Encounter (Signed)
 Submitted a Prior Authorization RENEWAL request to [facility] for RASUVO via CoverMyMeds. Will update once we receive a response.  Key: [id number]

## 2024-01-06 NOTE — Telephone Encounter (Signed)
 Pharmacy called needed PA for Rasuvo. Thank you.

## 2024-01-07 NOTE — Telephone Encounter (Signed)
 Received notification from [facility] regarding a prior authorization for RASUVO. Authorization has been APPROVED from 01/06/2024 to 01/05/2025. Approval letter sent to scan center.  Authorization #  [id number]  Chesley Mires, PharmD, MPH, BCPS, CPP Clinical Pharmacist (Rheumatology and Pulmonology)

## 2024-01-17 NOTE — Progress Notes (Unsigned)
 Office Visit Note  Patient: Sheena Rivera             Date of Birth: 09-04-1971           MRN: [id number]             PCP: Hampton Levins, PA-C Referring: Francois Isaacs* Visit Date: 01/31/2024 Occupation: @GUAROCC @  Subjective:  Discuss DEXA results   History of Present Illness: Marya Braver is a 53 y.o. female with history of rheumatoid arthritis.  Patient remains on  IV Simponi Aria, Rasuvo 20 mg sq injections every 7 days and folic acid 1 mg 2 tablets daily.  She is tolerating combination therapy without any side effects and has not had any recent gaps in therapy.  Patient presents today with left wrist pain.  Patient states that her symptoms have been consistent for the past few weeks.  She denies any injury prior to the onset of symptoms.  Patient states that her pain has been a 6 out of 10.  She has started to notice limited range of motion as well as difficulty opening jars.  She denies any nocturnal pain in her left wrist at this time.  Patient denies any breakthrough discomfort leading up to her infusions and states that the pain was previously sporadic but is now consistent.  She denies taking any over-the-counter products for symptomatic relief due to the concern for organ damage.  She denies any other joint pain or joint swelling at this time. Patient presented today did also discuss her DEXA results from November 2024.  She has been taking calcium and vitamin D supplement as advised. She denies any recent or recurrent infections.     Activities of Daily Living:  Patient reports morning stiffness for 15-20 minutes.   Patient Denies nocturnal pain.  Difficulty dressing/grooming: Denies Difficulty climbing stairs: Denies Difficulty getting out of chair: Denies Difficulty using hands for taps, buttons, cutlery, and/or writing: Denies  Review of Systems  Constitutional:  Positive for fatigue.  HENT:  Negative for mouth sores, mouth dryness and nose  dryness.   Eyes:  Negative for pain and dryness.  Respiratory:  Negative for shortness of breath and difficulty breathing.   Cardiovascular:  Negative for chest pain and palpitations.  Gastrointestinal:  Negative for blood in stool, constipation and diarrhea.  Endocrine: Negative for increased urination.  Genitourinary:  Negative for involuntary urination.  Musculoskeletal:  Positive for joint pain, joint pain, joint swelling and morning stiffness. Negative for gait problem, myalgias, muscle weakness, muscle tenderness and myalgias.  Skin:  Negative for color change, rash, hair loss and sensitivity to sunlight.  Allergic/Immunologic: Negative for susceptible to infections.  Neurological:  Negative for dizziness and headaches.  Hematological:  Negative for swollen glands.  Psychiatric/Behavioral:  Negative for depressed mood and sleep disturbance. The patient is not nervous/anxious.     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) 10/01/2016   High risk medication use 10/01/2016   Vitamin D deficiency 10/01/2016   Plantar fasciitis 10/01/2016    Past Medical History:  Diagnosis Date   Arthritis    Rheumatoid arthritis (HCC)     Family History  Problem Relation Age of Onset   Ovarian cancer Mother    COPD Father    Cancer - Lung Father    Depression Son    Past Surgical History:  Procedure Laterality  Date   ABLATION     Social History   Social History Narrative   Not on file   Immunization History  Administered Date(s) Administered   Influenza-Unspecified 08/19/2014   Moderna Sars-Covid-2 Vaccination 11/14/2019, 12/12/2019     Objective: Vital Signs: BP 122/86 (BP Location: Left Arm, Patient Position: Sitting, Cuff Size: Normal)   Pulse 97   Resp 14   Ht 5\' 1"  (1.549 m)   Wt 147 lb 12.8 oz (67 kg)   BMI 27.93 kg/m    Physical  Exam Vitals and nursing note reviewed.  Constitutional:      Appearance: She is well-developed.  HENT:     Head: Normocephalic and atraumatic.  Eyes:     Conjunctiva/sclera: Conjunctivae normal.  Cardiovascular:     Rate and Rhythm: Normal rate and regular rhythm.     Heart sounds: Normal heart sounds.  Pulmonary:     Effort: Pulmonary effort is normal.     Breath sounds: Normal breath sounds.  Abdominal:     General: Bowel sounds are normal.     Palpations: Abdomen is soft.  Musculoskeletal:     Cervical back: Normal range of motion.  Lymphadenopathy:     Cervical: No cervical adenopathy.  Skin:    General: Skin is warm and dry.     Capillary Refill: Capillary refill takes less than 2 seconds.  Neurological:     Mental Status: She is alert and oriented to person, place, and time.  Psychiatric:        Behavior: Behavior normal.      Musculoskeletal Exam: C-spine, thoracic spine, and lumbar spine good ROM with no discomfort.  Shoulder joints and elbow joints have good ROM.  Limited flexion and extension of the left wrist. Tenderness and inflammation noted on the ulnar aspect of the left wrist.  Right wrist has good ROM with no tenderness or joint swelling.  No tenderness or synovitis over MCP joints.  Complete fist formation bilaterally.  Hip joints have good range of motion with no groin pain.  Knee joints have good range of motion with no warmth or effusion.  Ankle joints have good range of motion with no tenderness or joint swelling.  CDAI Exam: CDAI Score: -- Patient Global: --; Provider Global: -- Swollen: --; Tender: -- Joint Exam 01/31/2024   No joint exam has been documented for this visit   There is currently no information documented on the homunculus. Go to the Rheumatology activity and complete the homunculus joint exam.  Investigation: No additional findings.  Imaging: US  Guided Needle Placement Result Date: 01/31/2024 Ultrasound guided injection is  preferred based studies that show increased duration, increased effect, greater accuracy, decreased procedural pain, increased response rate, and decreased cost with ultrasound guided versus blind injection.   Verbal informed consent obtained.  Time-out conducted.  Noted no overlying erythema, induration, or other signs of local infection. Ultrasound-guided intercarpal joint: After sterile prep with Betadine, injected 0.5 mL of 1% lidocaine and 30 mg Kenalog using a 27-gauge needle, and the intercarpal joint.     Recent Labs: Lab Results  Component Value Date   WBC 9.8 12/10/2023   HGB 13.9 12/10/2023   PLT 339 12/10/2023   NA 139 12/10/2023   K 4.3 12/10/2023   CL 106 12/10/2023   CO2 23 12/10/2023   GLUCOSE 106 (H) 12/10/2023   BUN 10 12/10/2023   CREATININE 0.65 12/10/2023   BILITOT 0.9 12/10/2023   ALKPHOS 91 06/25/2023   AST 21 12/10/2023  ALT 18 12/10/2023   PROT 6.7 12/10/2023   ALBUMIN 4.2 06/25/2023   CALCIUM 9.1 12/10/2023   GFRAA 120 02/06/2021   QFTBGOLD NEGATIVE 08/23/2017   QFTBGOLDPLUS NEGATIVE 06/25/2023    Speciality Comments: Oral MTX = caused nausea (2020)  Procedures:  Medium Joint Inj: L intercarpal on 01/31/2024 8:31 AM Indications: pain and joint swelling Details: 27 G 1.5 in needle, dorsal approach Medications: 0.5 mL lidocaine 1 %; 30 mg triamcinolone acetonide 40 MG/ML Aspirate: 0 mL Procedure, treatment alternatives, risks and benefits explained, specific risks discussed. Consent was given by the patient. Immediately prior to procedure a time out was called to verify the correct patient, procedure, equipment, support staff and site/side marked as required. Patient was prepped and draped in the usual sterile fashion.     Allergies: Patient has no known allergies.   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  Tenderness and inflammation were noted along the ulnar aspect.  She had limited flexion and extension of the left wrist.  No extensor tenosynovitis noted.  She is not experiencing any other increased arthralgias and has no other joints with active inflammation.  An ultrasound-guided left wrist injection was performed while the patient was in the office today-procedure note completed above. Patient has remained on IV Simponi Aria infusions every 8 weeks and Rasuvo 20 mg subcutaneous injections once weekly without interruption.  She has not been taking any OTC products for pain relief. Patient declined to have updated x-rays of both hands and both feet to assess for radiographic progression at this time. She will remain on IV Simponi infusions every 8 weeks, Rasuvo 20 mg sq injections once weekly, and folic acid 2 mg daily.  A refill of Rasuvo will be sent to the pharmacy today.  She was advised to notify us  if she starts to have recurrent flares.  She will follow-up in the office in 5 months or sooner if needed.   High risk medication use - IV Simponi Aria infusions every 8 weeks, Rasuvo 20 mg sq injections every 7 days, and folic acid 1 mg 2 tablets daily. CBC and CMP updated on 12/10/23--reviewed lab results today with the patient. She will continue to update lab work with infusions. TB gold negative on 06/25/23.  No recent or recurrent infections.  Discussed the importance of holding simponi aria and rasuvo if she develops signs or symptoms of an infection and to resume once the infection has completely cleared.   Pain in left wrist - Patient presents today for further evaluation of left wrist pain.  No injury prior to the onset of symptoms.  Her symptoms initially were sporadic but over the past few weeks have been consistent.  She has noticed limited range of motion as well as difficulty opening bottles due to the pain and limitation of motion.  Her pain level has been a 6 out of  10.  She has not had any nocturnal pain in the left wrist.  She has not been taking any OTC products for pain relief due to the concern for organ damage/side effects. On examination she has limited flexion and extension of the left wrist.  Tenderness and inflammation along the ulnar aspect of the left wrist noted.  Different treatment options were discussed.  Patient opted for an ultrasound-guided injection today.  She tolerated procedure well.  Procedure  note was completed above.  Aftercare was discussed.  She was advised to notify us  if her symptoms persist or worsen.  Plan: US  Guided Needle Placement  Bilateral knee effusions: No warmth or effusion noted.   Osteopenia of multiple sites: Patient presented today to discuss DEXA results from 09/08/2023: Right total hip BMD 0.705 with T-score -1.9.  No comparison available.  FRAX 10-year probability of major osteoporotic fracture is 7.7% and hip fracture is 0.8%. all questions were addressed.  Discussed the importance of calcium and vitamin D supplementation.  Discussed the importance of resistive exercises.  Plan to repeat DEXA in November 2026.  Vitamin D deficiency: She is taking vitamin D 1000 units daily.   Other medical conditions are listed as follows:   History of tubal ligation  Arnold-Chiari malformation (HCC)  Dyslipidemia        Orders: Orders Placed This Encounter  Procedures   Medium Joint Inj: L intercarpal   US  Guided Needle Placement   No orders of the defined types were placed in this encounter.    Follow-Up Instructions: Return in about 5 months (around 07/02/2024) for Rheumatoid arthritis.   Romayne Clubs, PA-C  Note - This record has been created using Dragon software.  Chart creation errors have been sought, but may not always  have been located. Such creation errors do not reflect on  the standard of medical care.

## 2024-01-31 ENCOUNTER — Other Ambulatory Visit: Payer: Self-pay

## 2024-01-31 ENCOUNTER — Ambulatory Visit (INDEPENDENT_AMBULATORY_CARE_PROVIDER_SITE_OTHER)

## 2024-01-31 ENCOUNTER — Encounter: Payer: Self-pay | Admitting: Physician Assistant

## 2024-01-31 ENCOUNTER — Ambulatory Visit: Payer: No Typology Code available for payment source | Attending: Physician Assistant | Admitting: Physician Assistant

## 2024-01-31 VITALS — BP 122/86 | HR 97 | Resp 14 | Ht 61.0 in | Wt 147.8 lb

## 2024-01-31 DIAGNOSIS — M25532 Pain in left wrist: Secondary | ICD-10-CM

## 2024-01-31 DIAGNOSIS — M25461 Effusion, right knee: Secondary | ICD-10-CM | POA: Diagnosis not present

## 2024-01-31 DIAGNOSIS — M0609 Rheumatoid arthritis without rheumatoid factor, multiple sites: Secondary | ICD-10-CM

## 2024-01-31 DIAGNOSIS — Z79899 Other long term (current) drug therapy: Secondary | ICD-10-CM | POA: Diagnosis not present

## 2024-01-31 DIAGNOSIS — E785 Hyperlipidemia, unspecified: Secondary | ICD-10-CM

## 2024-01-31 DIAGNOSIS — M25462 Effusion, left knee: Secondary | ICD-10-CM

## 2024-01-31 DIAGNOSIS — Q0700 Arnold-Chiari syndrome without spina bifida or hydrocephalus: Secondary | ICD-10-CM

## 2024-01-31 DIAGNOSIS — Z13820 Encounter for screening for osteoporosis: Secondary | ICD-10-CM

## 2024-01-31 DIAGNOSIS — E559 Vitamin D deficiency, unspecified: Secondary | ICD-10-CM | POA: Diagnosis not present

## 2024-01-31 DIAGNOSIS — M8589 Other specified disorders of bone density and structure, multiple sites: Secondary | ICD-10-CM

## 2024-01-31 DIAGNOSIS — Z9851 Tubal ligation status: Secondary | ICD-10-CM

## 2024-01-31 MED ORDER — LIDOCAINE HCL 1 % IJ SOLN
0.5000 mL | INTRAMUSCULAR | Status: AC | PRN
  Administered 2024-01-31: .5 mL

## 2024-01-31 MED ORDER — RASUVO 20 MG/0.4ML ~~LOC~~ SOAJ
SUBCUTANEOUS | 0 refills | Status: DC
Start: 2024-01-31 — End: 2024-04-17

## 2024-01-31 MED ORDER — TRIAMCINOLONE ACETONIDE 40 MG/ML IJ SUSP
30.0000 mg | INTRAMUSCULAR | Status: AC | PRN
  Administered 2024-01-31: 30 mg via INTRA_ARTICULAR

## 2024-01-31 NOTE — Telephone Encounter (Signed)
 Please review and sign pended refill for Rasuvo. Thanks!

## 2024-02-07 ENCOUNTER — Ambulatory Visit (INDEPENDENT_AMBULATORY_CARE_PROVIDER_SITE_OTHER): Payer: No Typology Code available for payment source

## 2024-02-07 ENCOUNTER — Other Ambulatory Visit: Payer: Self-pay

## 2024-02-07 VITALS — BP 143/93 | HR 81 | Temp 97.8°F | Resp 16 | Ht 61.0 in | Wt 146.6 lb

## 2024-02-07 DIAGNOSIS — Z79899 Other long term (current) drug therapy: Secondary | ICD-10-CM

## 2024-02-07 DIAGNOSIS — M0609 Rheumatoid arthritis without rheumatoid factor, multiple sites: Secondary | ICD-10-CM | POA: Diagnosis not present

## 2024-02-07 LAB — COMPLETE METABOLIC PANEL WITHOUT GFR
AG Ratio: 1.4 (calc) (ref 1.0–2.5)
ALT: 9 U/L (ref 6–29)
AST: 14 U/L (ref 10–35)
Albumin: 4.2 g/dL (ref 3.6–5.1)
Alkaline phosphatase (APISO): 79 U/L (ref 37–153)
BUN: 15 mg/dL (ref 7–25)
CO2: 24 mmol/L (ref 20–32)
Calcium: 9.4 mg/dL (ref 8.6–10.4)
Chloride: 107 mmol/L (ref 98–110)
Creat: 0.68 mg/dL (ref 0.50–1.03)
Globulin: 2.9 g/dL (ref 1.9–3.7)
Glucose, Bld: 116 mg/dL — ABNORMAL HIGH (ref 65–99)
Potassium: 4.2 mmol/L (ref 3.5–5.3)
Sodium: 141 mmol/L (ref 135–146)
Total Bilirubin: 0.7 mg/dL (ref 0.2–1.2)
Total Protein: 7.1 g/dL (ref 6.1–8.1)

## 2024-02-07 LAB — CBC WITH DIFFERENTIAL/PLATELET
Absolute Lymphocytes: 2348 {cells}/uL (ref 850–3900)
Absolute Monocytes: 550 {cells}/uL (ref 200–950)
Basophils Absolute: 95 {cells}/uL (ref 0–200)
Basophils Relative: 1.1 %
Eosinophils Absolute: 344 {cells}/uL (ref 15–500)
Eosinophils Relative: 4 %
HCT: 45.1 % — ABNORMAL HIGH (ref 35.0–45.0)
Hemoglobin: 14.9 g/dL (ref 11.7–15.5)
MCH: 29.4 pg (ref 27.0–33.0)
MCHC: 33 g/dL (ref 32.0–36.0)
MCV: 89 fL (ref 80.0–100.0)
MPV: 10.4 fL (ref 7.5–12.5)
Monocytes Relative: 6.4 %
Neutro Abs: 5263 {cells}/uL (ref 1500–7800)
Neutrophils Relative %: 61.2 %
Platelets: 358 10*3/uL (ref 140–400)
RBC: 5.07 10*6/uL (ref 3.80–5.10)
RDW: 12.4 % (ref 11.0–15.0)
Total Lymphocyte: 27.3 %
WBC: 8.6 10*3/uL (ref 3.8–10.8)

## 2024-02-07 MED ORDER — ACETAMINOPHEN 325 MG PO TABS
650.0000 mg | ORAL_TABLET | Freq: Once | ORAL | Status: AC
  Administered 2024-02-07: 650 mg via ORAL
  Filled 2024-02-07: qty 2

## 2024-02-07 MED ORDER — SODIUM CHLORIDE 0.9 % IV SOLN
2.0000 mg/kg | Freq: Once | INTRAVENOUS | Status: AC
  Administered 2024-02-07: 132.5 mg via INTRAVENOUS
  Filled 2024-02-07: qty 10.6

## 2024-02-07 MED ORDER — DIPHENHYDRAMINE HCL 25 MG PO CAPS
25.0000 mg | ORAL_CAPSULE | Freq: Once | ORAL | Status: AC
  Administered 2024-02-07: 25 mg via ORAL
  Filled 2024-02-07: qty 1

## 2024-02-07 NOTE — Progress Notes (Signed)
 Diagnosis: Rheumatoid Arthritis  Provider:  Praveen Mannam MD  Procedure: IV Infusion  IV Type: Peripheral, IV Location: L Forearm  Simponi  Aria (Golimumab ), Dose: 132.5mg   Infusion Start Time: 0925  Infusion Stop Time: 0957  Post Infusion IV Care: Peripheral IV Discontinued  Discharge: Condition: Good, Destination: Home . AVS Declined  Performed by:  Shirly Dow, RN

## 2024-02-08 NOTE — Progress Notes (Signed)
CBC and CMP were normal.

## 2024-04-03 ENCOUNTER — Ambulatory Visit

## 2024-04-07 ENCOUNTER — Ambulatory Visit (INDEPENDENT_AMBULATORY_CARE_PROVIDER_SITE_OTHER)

## 2024-04-07 ENCOUNTER — Other Ambulatory Visit: Payer: Self-pay

## 2024-04-07 VITALS — BP 127/84 | HR 83 | Temp 97.8°F | Resp 18 | Ht 61.0 in | Wt 148.0 lb

## 2024-04-07 DIAGNOSIS — Z79899 Other long term (current) drug therapy: Secondary | ICD-10-CM | POA: Diagnosis not present

## 2024-04-07 DIAGNOSIS — M0609 Rheumatoid arthritis without rheumatoid factor, multiple sites: Secondary | ICD-10-CM | POA: Diagnosis not present

## 2024-04-07 MED ORDER — SODIUM CHLORIDE 0.9 % IV SOLN
132.5000 mg | Freq: Once | INTRAVENOUS | Status: AC
  Administered 2024-04-07: 132.5 mg via INTRAVENOUS
  Filled 2024-04-07: qty 10.6

## 2024-04-07 MED ORDER — DIPHENHYDRAMINE HCL 25 MG PO CAPS
25.0000 mg | ORAL_CAPSULE | Freq: Once | ORAL | Status: AC
  Administered 2024-04-07: 25 mg via ORAL
  Filled 2024-04-07: qty 1

## 2024-04-07 MED ORDER — ACETAMINOPHEN 325 MG PO TABS
650.0000 mg | ORAL_TABLET | Freq: Once | ORAL | Status: AC
  Administered 2024-04-07: 650 mg via ORAL
  Filled 2024-04-07: qty 2

## 2024-04-07 NOTE — Progress Notes (Cosign Needed)
 Diagnosis: Rheumatoid Arthritis  Provider:  Phyllis Breeze MD  Procedure: IV Infusion  IV Type: Peripheral, IV Location: L Forearm  Simponi  Aria (Golimumab ), Dose: 132.5 mg  Infusion Start Time: 1403  Infusion Stop Time: 1445  Post Infusion IV Care: Peripheral IV Discontinued  Discharge: Condition: Good, Destination: Home . AVS Declined  Performed by:  Rachelle Bue, RN

## 2024-04-08 LAB — COMPREHENSIVE METABOLIC PANEL WITH GFR
AG Ratio: 1.7 (calc) (ref 1.0–2.5)
ALT: 7 U/L (ref 6–29)
AST: 14 U/L (ref 10–35)
Albumin: 4.2 g/dL (ref 3.6–5.1)
Alkaline phosphatase (APISO): 74 U/L (ref 37–153)
BUN: 7 mg/dL (ref 7–25)
CO2: 25 mmol/L (ref 20–32)
Calcium: 9 mg/dL (ref 8.6–10.4)
Chloride: 105 mmol/L (ref 98–110)
Creat: 0.59 mg/dL (ref 0.50–1.03)
Globulin: 2.5 g/dL (ref 1.9–3.7)
Glucose, Bld: 106 mg/dL — ABNORMAL HIGH (ref 65–99)
Potassium: 4.1 mmol/L (ref 3.5–5.3)
Sodium: 139 mmol/L (ref 135–146)
Total Bilirubin: 0.7 mg/dL (ref 0.2–1.2)
Total Protein: 6.7 g/dL (ref 6.1–8.1)
eGFR: 108 mL/min/{1.73_m2} (ref >=60)

## 2024-04-08 LAB — CBC WITH DIFFERENTIAL/PLATELET
Absolute Lymphocytes: 2132 {cells}/uL (ref 850–3900)
Absolute Monocytes: 620 {cells}/uL (ref 200–950)
Basophils Absolute: 95 {cells}/uL (ref 0–200)
Basophils Relative: 0.9 %
Eosinophils Absolute: 347 {cells}/uL (ref 15–500)
Eosinophils Relative: 3.3 %
HCT: 42.2 % (ref 35.0–45.0)
Hemoglobin: 14.2 g/dL (ref 11.7–15.5)
MCH: 30.2 pg (ref 27.0–33.0)
MCHC: 33.6 g/dL (ref 32.0–36.0)
MCV: 89.8 fL (ref 80.0–100.0)
MPV: 10.3 fL (ref 7.5–12.5)
Monocytes Relative: 5.9 %
Neutro Abs: 7308 {cells}/uL (ref 1500–7800)
Neutrophils Relative %: 69.6 %
Platelets: 347 10*3/uL (ref 140–400)
RBC: 4.7 10*6/uL (ref 3.80–5.10)
RDW: 12.3 % (ref 11.0–15.0)
Total Lymphocyte: 20.3 %
WBC: 10.5 10*3/uL (ref 3.8–10.8)

## 2024-04-09 ENCOUNTER — Ambulatory Visit: Payer: Self-pay | Admitting: Physician Assistant

## 2024-04-17 ENCOUNTER — Other Ambulatory Visit: Payer: Self-pay | Admitting: Physician Assistant

## 2024-04-17 DIAGNOSIS — M0609 Rheumatoid arthritis without rheumatoid factor, multiple sites: Secondary | ICD-10-CM

## 2024-04-17 NOTE — Telephone Encounter (Signed)
 Last Fill: 01/31/2024  Labs: 04/07/2024 CBC WNL. Glucose is 106. Rest of CMP WNL   Next Visit: 07/03/2024  Last Visit: 01/31/2024  DX:  Rheumatoid arthritis of multiple sites without rheumatoid factor   Current Dose per office note 01/31/2024: Rasuvo  20 mg sq injections every 7 days   Okay to refill Rasuvo ?

## 2024-05-25 ENCOUNTER — Telehealth: Payer: Self-pay

## 2024-05-25 NOTE — Telephone Encounter (Signed)
 Vm was left by [facility] stating they would like to set up delivery date for office based Simponi  injection  and the office address. They would like a call back to confirm the above information.

## 2024-06-02 ENCOUNTER — Ambulatory Visit (INDEPENDENT_AMBULATORY_CARE_PROVIDER_SITE_OTHER)

## 2024-06-02 VITALS — BP 132/84 | HR 78 | Temp 98.1°F | Resp 16 | Ht 61.0 in | Wt 147.6 lb

## 2024-06-02 DIAGNOSIS — Z13820 Encounter for screening for osteoporosis: Secondary | ICD-10-CM

## 2024-06-02 DIAGNOSIS — Z79899 Other long term (current) drug therapy: Secondary | ICD-10-CM

## 2024-06-02 DIAGNOSIS — M0609 Rheumatoid arthritis without rheumatoid factor, multiple sites: Secondary | ICD-10-CM

## 2024-06-02 DIAGNOSIS — Z780 Asymptomatic menopausal state: Secondary | ICD-10-CM

## 2024-06-02 MED ORDER — DIPHENHYDRAMINE HCL 25 MG PO CAPS
25.0000 mg | ORAL_CAPSULE | Freq: Once | ORAL | Status: AC
  Administered 2024-06-02: 25 mg via ORAL

## 2024-06-02 MED ORDER — ACETAMINOPHEN 325 MG PO TABS
650.0000 mg | ORAL_TABLET | Freq: Once | ORAL | Status: AC
  Administered 2024-06-02: 650 mg via ORAL

## 2024-06-02 MED ORDER — SODIUM CHLORIDE 0.9 % IV SOLN
2.0000 mg/kg | Freq: Once | INTRAVENOUS | Status: AC
  Administered 2024-06-02: 133.75 mg via INTRAVENOUS
  Filled 2024-06-02 (×2): qty 10.7

## 2024-06-02 NOTE — Addendum Note (Signed)
 Addended by: RANNIE LEITA BRAVO on: 06/02/2024 11:57 AM   Modules accepted: Orders

## 2024-06-02 NOTE — Progress Notes (Signed)
 Diagnosis: Rheumatoid Arthritis  Provider:  Lonna Coder MD  Procedure: IV Infusion  IV Type: Peripheral, IV Location: L Wrist  Simponi Aria (Golimumab), Dose: 133.75  Infusion Start Time: 1000  Infusion Stop Time: 1030  Post Infusion IV Care: PIV Discontinued  Discharge: Condition: Good, Destination: Home . AVS Declined  Performed by:  Eleanor DELENA Bloch, RN

## 2024-06-03 ENCOUNTER — Ambulatory Visit: Payer: Self-pay | Admitting: Rheumatology

## 2024-06-03 LAB — COMPREHENSIVE METABOLIC PANEL WITH GFR
AG Ratio: 1.5 (calc) (ref 1.0–2.5)
ALT: 10 U/L (ref 6–29)
AST: 17 U/L (ref 10–35)
Albumin: 4.3 g/dL (ref 3.6–5.1)
Alkaline phosphatase (APISO): 87 U/L (ref 37–153)
BUN: 9 mg/dL (ref 7–25)
CO2: 22 mmol/L (ref 20–32)
Calcium: 9.1 mg/dL (ref 8.6–10.4)
Chloride: 104 mmol/L (ref 98–110)
Creat: 0.69 mg/dL (ref 0.50–1.03)
Globulin: 2.8 g/dL (ref 1.9–3.7)
Glucose, Bld: 150 mg/dL — ABNORMAL HIGH (ref 65–99)
Potassium: 4.1 mmol/L (ref 3.5–5.3)
Sodium: 137 mmol/L (ref 135–146)
Total Bilirubin: 0.8 mg/dL (ref 0.2–1.2)
Total Protein: 7.1 g/dL (ref 6.1–8.1)
eGFR: 104 mL/min/1.73m2 (ref >=60)

## 2024-06-03 LAB — CBC WITH DIFFERENTIAL/PLATELET

## 2024-06-03 NOTE — Progress Notes (Signed)
 CBC and CMP are normal except elevated glucose, probably not a fasting sample.  Please notify patient and forward results to her PCP.

## 2024-06-19 NOTE — Progress Notes (Signed)
 Office Visit Note  Patient: Sheena Rivera             Date of Birth: June 07, 1971           MRN: [id number]             PCP: Evangelina Tinnie Norris, PA-C Referring: Evangelina Tinnie Brier* Visit Date: 07/03/2024 Occupation: @GUAROCC @  Subjective:  Medication monitoring   History of Present Illness: Sheena Rivera is a 53 y.o. female with history of rheumatoid arthritis.  Patient remains on  IV Simponi  Aria infusions every 8 weeks, Rasuvo  20 mg sq injections every 7 days, and folic acid  1 mg 2 tablets daily.  She is tolerating combination therapy without any side effects and has not had any gaps in therapy.  She is due for her next Simponi  Aria infusion on 07/28/2024.  Patient states that she has been experiencing a flare involving the left wrist and both knees for the past 1-1/2 weeks. She denies any identifiable trigger for the flare.  She is currently experiencing swelling in both knees.  She has been taking Advil as needed for symptomatic relief.  Patient states that at times she feels a catching sensation and a popping sensation in the right knee. She denies any other joint pain or joint swelling.  She has declined to [facility] any medication changes at this time.    Activities of Daily Living:  Patient reports morning stiffness for 15-20 minutes.   Patient Denies nocturnal pain.  Difficulty dressing/grooming: Denies Difficulty climbing stairs: Reports Difficulty getting out of chair: Denies Difficulty using hands for taps, buttons, cutlery, and/or writing: Denies  Review of Systems  Constitutional:  Positive for fatigue.  HENT:  Positive for mouth dryness. Negative for mouth sores.   Eyes:  Positive for dryness.  Respiratory:  Negative for shortness of breath and difficulty breathing.   Cardiovascular:  Negative for chest pain and palpitations.  Gastrointestinal:  Negative for blood in stool, constipation and diarrhea.  Endocrine: Negative for increased urination.  Genitourinary:   Negative for involuntary urination.  Musculoskeletal:  Positive for joint pain, gait problem, joint pain, joint swelling and morning stiffness. Negative for myalgias, muscle weakness, muscle tenderness and myalgias.  Skin:  Negative for color change, rash, hair loss and sensitivity to sunlight.  Allergic/Immunologic: Negative for susceptible to infections.  Neurological:  Negative for dizziness and headaches.  Hematological:  Negative for swollen glands.  Psychiatric/Behavioral:  Negative for depressed mood and sleep disturbance. The patient is not nervous/anxious.     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) 10/01/2016   High risk medication use 10/01/2016   Vitamin D  deficiency 10/01/2016   Plantar fasciitis 10/01/2016    Past Medical History:  Diagnosis Date   Arthritis    Lichen sclerosus 2025   dx by OBGYN per patient   Rheumatoid arthritis (HCC)     Family History  Problem Relation Age of Onset   Ovarian cancer Mother    COPD Father    Cancer - Lung Father    Depression Son    Past Surgical History:  Procedure Laterality Date   ABLATION     Social History   Social History Narrative   Not on file   Immunization History  Administered Date(s) Administered   Influenza-Unspecified 08/19/2014   Moderna Sars-Covid-2 Vaccination 11/14/2019, 12/12/2019     Objective: Vital Signs: BP  135/88   Pulse 93   Temp 97.9 F (36.6 C)   Resp 13   Ht 5' 1 (1.549 m)   Wt 148 lb (67.1 kg)   BMI 27.96 kg/m    Physical Exam Vitals and nursing note reviewed.  Constitutional:      Appearance: She is well-developed.  HENT:     Head: Normocephalic and atraumatic.  Eyes:     Conjunctiva/sclera: Conjunctivae normal.  Cardiovascular:     Rate and Rhythm: Normal rate and regular rhythm.     Heart sounds: Normal heart sounds.  Pulmonary:      Effort: Pulmonary effort is normal.     Breath sounds: Normal breath sounds.  Abdominal:     General: Bowel sounds are normal.     Palpations: Abdomen is soft.  Musculoskeletal:     Cervical back: Normal range of motion.  Lymphadenopathy:     Cervical: No cervical adenopathy.  Skin:    General: Skin is warm and dry.     Capillary Refill: Capillary refill takes less than 2 seconds.  Neurological:     Mental Status: She is alert and oriented to person, place, and time.  Psychiatric:        Behavior: Behavior normal.      Musculoskeletal Exam: C-spine, thoracic spine, and lumbar spine good ROM.  Shoulder joints and elbow joints have good range of motion.  Tenderness and synovitis along the ulnar aspect of the left wrist.  No tenderness or synovitis over MCP joints.  Complete fist formation bilaterally.  Hip joints have good range of motion with no groin pain.  Warmth and swelling of both knee joints noted.  Ankle joints have good range of motion no tenderness or joint swelling.  CDAI Exam: CDAI Score: -- Patient Global: --; Provider Global: -- Swollen: --; Tender: -- Joint Exam 07/03/2024   No joint exam has been documented for this visit   There is currently no information documented on the homunculus. Go to the Rheumatology activity and complete the homunculus joint exam.  Investigation: No additional findings.  Imaging: No results found.  Recent Labs: Lab Results  Component Value Date   WBC CANCELED 06/02/2024   HGB 14.2 04/07/2024   PLT 347 04/07/2024   NA 137 06/02/2024   K 4.1 06/02/2024   CL 104 06/02/2024   CO2 22 06/02/2024   GLUCOSE 150 (H) 06/02/2024   BUN 9 06/02/2024   CREATININE 0.69 06/02/2024   BILITOT 0.8 06/02/2024   ALKPHOS 91 06/25/2023   AST 17 06/02/2024   ALT 10 06/02/2024   PROT 7.1 06/02/2024   ALBUMIN 4.2 06/25/2023   CALCIUM 9.1 06/02/2024   GFRAA 120 02/06/2021   QFTBGOLD NEGATIVE 08/23/2017   QFTBGOLDPLUS NEGATIVE 06/25/2023     Speciality Comments: Oral MTX = caused nausea (2020)  Procedures:  No procedures performed Allergies: Patient has no known allergies.   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  She has been unable to identify a trigger for the flare and has not had any recent gaps in therapy.  Warmth and effusions noted in both knees as well as tenderness and inflammation noted on the ulnar aspect of the left wrist.  She has been taking advil as needed for symptomatic relief. She remains on Simponi  Aria infusions  every 8 weeks, Rasuvo  20 mg sq injections once weekly, and folic acid  2 mg daily.  She has been tolerating combination therapy without any side effects.  Overall she has found the combination to be effective at managing her symptoms and does not want to make any medication changes at this time.  Different treatment options were discussed today including proceeding with knee joint injections versus a prednisone  taper.  Since multiple joints are involved she has opted for a prednisone  taper--she was started 20 mg tapering by 5 mg every 4 days.  She was advised to notify us  if her symptoms persist or worsen.  She will remain on Simponi  Aria and Rasuvo  as prescribed.  A refill of Rasuvo  will be sent to the pharmacy today.  She was advised to notify us  if she continues to have recurrent flares at which time we will need to make medication changes.  She will follow-up in the office in 5 months or sooner if needed.  High risk medication use - IV Simponi  Aria infusions every 8 weeks, Rasuvo  20 mg sq injections every 7 days, and folic acid  1 mg 2 tablets daily.  CBC and CMP updated on 06/02/24.  She will continue to get lab work with infusions. TB gold negative on 06/25/23.  Lipid panel updated on  08/13/23.  No recent or recurrent infections.  Discussed the importance of holding simponi  aria and rasuvo  if she develops signs or symptoms of an infection and to resume once the infection has completely cleared.   Plan: QuantiFERON-TB Gold Plus  Screening for tuberculosis - Future order for TB gold placed today. Plan: QuantiFERON-TB Gold Plus  Bilateral knee effusions: Patient presents today experiencing a flare involving both knees.  Discussed the option of knee joint aspiration/cortisone injection today but she opted for a prednisone  taper since she is also having a flare in the left breast.  A prednisone  taper was sent to the pharmacy  Vitamin D  deficiency: She is taking vitamin D  1000 units daily.  Other medical conditions are listed as follows:   History of tubal ligation  Arnold-Chiari malformation (HCC)  Dyslipidemia  Osteopenia of multiple sites - DEXA results from 09/08/2023: Right total hip BMD 0.705 with T-score -1.9.  No comparison available.  FRAX 10-year  major fx 7.7% and hip fracture is 0.8%.    Orders: Orders Placed This Encounter  Procedures   QuantiFERON-TB Gold Plus   Meds ordered this encounter  Medications   predniSONE  (DELTASONE ) 5 MG tablet    Sig: Take 4 tabs po x 4 days, 3  tabs po x 4 days, 2  tabs po x 4 days, 1  tab po x 4 days    Dispense:  40 tablet    Refill:  0    Follow-Up Instructions: Return in about 5 months (around 12/03/2024) for Rheumatoid arthritis.   Waddell CHRISTELLA Craze, PA-C  Note - This record has been created using Dragon software.  Chart creation errors have been sought, but may not always  have been located. Such creation errors do not reflect on  the standard of medical care.

## 2024-06-26 ENCOUNTER — Telehealth: Payer: Self-pay

## 2024-06-26 NOTE — Telephone Encounter (Signed)
 Received notification from [facility] regarding a prior authorization for SIMPONI  ARIA. Authorization has been APPROVED from 06/26/2024 to 06/26/2025. Approval letter sent to scan center.  Authorization #  [id number]

## 2024-06-26 NOTE — Telephone Encounter (Addendum)
 Submitted a Prior Authorization request to [employer] for Simponi  Aria (IV) via CoverMyMeds. Will update once we receive a response.  Key: [id number]

## 2024-06-29 ENCOUNTER — Telehealth: Payer: Self-pay

## 2024-06-29 NOTE — Telephone Encounter (Addendum)
 Auth Submission: APPROVED Site of care: Site of care: [facility] Payer: [employer] Medication & CPT/J Code(s) submitted: Simponi  aria (Golimumab ) [id number] Diagnosis Code:  Route of submission (phone, fax, portal):  Phone # Fax # Auth type: Buy/Bill PB Units/visits requested: 2mg /kg every weeks Reference number: [id number]  Approval from: 06/26/24 to 06/26/25   Patient is getting this medication here at [facility] infusion, it is coming from [facility]. Spreadsheet and FYI flag have been updated.

## 2024-07-03 ENCOUNTER — Other Ambulatory Visit: Payer: Self-pay

## 2024-07-03 ENCOUNTER — Encounter: Payer: Self-pay | Admitting: Physician Assistant

## 2024-07-03 ENCOUNTER — Ambulatory Visit: Attending: Physician Assistant | Admitting: Physician Assistant

## 2024-07-03 VITALS — BP 135/88 | HR 93 | Temp 97.9°F | Resp 13 | Ht 61.0 in | Wt 148.0 lb

## 2024-07-03 DIAGNOSIS — M25462 Effusion, left knee: Secondary | ICD-10-CM

## 2024-07-03 DIAGNOSIS — E785 Hyperlipidemia, unspecified: Secondary | ICD-10-CM

## 2024-07-03 DIAGNOSIS — M0609 Rheumatoid arthritis without rheumatoid factor, multiple sites: Secondary | ICD-10-CM

## 2024-07-03 DIAGNOSIS — M25461 Effusion, right knee: Secondary | ICD-10-CM

## 2024-07-03 DIAGNOSIS — M8589 Other specified disorders of bone density and structure, multiple sites: Secondary | ICD-10-CM

## 2024-07-03 DIAGNOSIS — Z111 Encounter for screening for respiratory tuberculosis: Secondary | ICD-10-CM

## 2024-07-03 DIAGNOSIS — E559 Vitamin D deficiency, unspecified: Secondary | ICD-10-CM | POA: Diagnosis not present

## 2024-07-03 DIAGNOSIS — Z9851 Tubal ligation status: Secondary | ICD-10-CM

## 2024-07-03 DIAGNOSIS — Q0700 Arnold-Chiari syndrome without spina bifida or hydrocephalus: Secondary | ICD-10-CM

## 2024-07-03 DIAGNOSIS — Z79899 Other long term (current) drug therapy: Secondary | ICD-10-CM | POA: Diagnosis not present

## 2024-07-03 MED ORDER — RASUVO 20 MG/0.4ML ~~LOC~~ SOAJ: SUBCUTANEOUS | 0 refills | Status: AC

## 2024-07-03 MED ORDER — PREDNISONE 5 MG PO TABS: ORAL_TABLET | ORAL | 0 refills | Status: DC

## 2024-07-03 NOTE — Progress Notes (Unsigned)
 Patient seen in office today. Contacted the patient and patient states she would like her Rasuvo  sent to [facility] in [place]. Please review and sign.

## 2024-07-28 ENCOUNTER — Ambulatory Visit

## 2024-07-30 ENCOUNTER — Other Ambulatory Visit: Payer: Self-pay | Admitting: Physician Assistant

## 2024-07-31 NOTE — Telephone Encounter (Signed)
 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

## 2024-08-04 ENCOUNTER — Ambulatory Visit (INDEPENDENT_AMBULATORY_CARE_PROVIDER_SITE_OTHER)

## 2024-08-04 VITALS — BP 120/80 | HR 92 | Temp 97.9°F | Resp 16 | Ht 61.0 in | Wt 150.0 lb

## 2024-08-04 DIAGNOSIS — Z79899 Other long term (current) drug therapy: Secondary | ICD-10-CM | POA: Diagnosis not present

## 2024-08-04 DIAGNOSIS — M0609 Rheumatoid arthritis without rheumatoid factor, multiple sites: Secondary | ICD-10-CM

## 2024-08-04 MED ORDER — DIPHENHYDRAMINE HCL 25 MG PO CAPS
25.0000 mg | ORAL_CAPSULE | Freq: Once | ORAL | Status: AC
  Administered 2024-08-04: 25 mg via ORAL
  Filled 2024-08-04: qty 1

## 2024-08-04 MED ORDER — SODIUM CHLORIDE 0.9 % IV SOLN
2.0000 mg/kg | Freq: Once | INTRAVENOUS | Status: AC
  Administered 2024-08-04: 136.25 mg via INTRAVENOUS
  Filled 2024-08-04: qty 10.9

## 2024-08-04 MED ORDER — ACETAMINOPHEN 325 MG PO TABS
650.0000 mg | ORAL_TABLET | Freq: Once | ORAL | Status: AC
  Administered 2024-08-04: 650 mg via ORAL
  Filled 2024-08-04: qty 2

## 2024-08-04 NOTE — Progress Notes (Signed)
 Diagnosis: Rheumatoid Arthritis  Provider:  Lonna Coder MD  Procedure: IV Infusion  IV Type: Peripheral, IV Location: L Wrist   Simponi  Aria (Golimumab ), Dose: 136.25mg   Infusion Start Time: 0907  Infusion Stop Time: 0940  Post Infusion IV Care: Peripheral IV Discontinued  Discharge: Condition: Good, Destination: Home . AVS Declined  Performed by:  Leita FORBES Miles, LPN

## 2024-08-05 ENCOUNTER — Ambulatory Visit: Payer: Self-pay | Admitting: Rheumatology

## 2024-08-05 LAB — CBC WITH DIFFERENTIAL/PLATELET
Absolute Lymphocytes: 2205 {cells}/uL (ref 850–3900)
Absolute Monocytes: 546 {cells}/uL (ref 200–950)
Basophils Absolute: 74 {cells}/uL (ref 0–200)
Basophils Relative: 0.7 %
Eosinophils Absolute: 378 {cells}/uL (ref 15–500)
Eosinophils Relative: 3.6 %
HCT: 45.7 % — ABNORMAL HIGH (ref 35.0–45.0)
Hemoglobin: 15 g/dL (ref 11.7–15.5)
MCH: 30.1 pg (ref 27.0–33.0)
MCHC: 32.8 g/dL (ref 32.0–36.0)
MCV: 91.6 fL (ref 80.0–100.0)
MPV: 10 fL (ref 7.5–12.5)
Monocytes Relative: 5.2 %
Neutro Abs: 7298 {cells}/uL (ref 1500–7800)
Neutrophils Relative %: 69.5 %
Platelets: 386 Thousand/uL (ref 140–400)
RBC: 4.99 Million/uL (ref 3.80–5.10)
RDW: 11.8 % (ref 11.0–15.0)
Total Lymphocyte: 21 %
WBC: 10.5 Thousand/uL (ref 3.8–10.8)

## 2024-08-05 LAB — COMPREHENSIVE METABOLIC PANEL WITH GFR
AG Ratio: 1.4 (calc) (ref 1.0–2.5)
ALT: 9 U/L (ref 6–29)
AST: 16 U/L (ref 10–35)
Albumin: 4.3 g/dL (ref 3.6–5.1)
Alkaline phosphatase (APISO): 83 U/L (ref 37–153)
BUN: 11 mg/dL (ref 7–25)
CO2: 25 mmol/L (ref 20–32)
Calcium: 9.6 mg/dL (ref 8.6–10.4)
Chloride: 105 mmol/L (ref 98–110)
Creat: 0.59 mg/dL (ref 0.50–1.03)
Globulin: 3 g/dL (ref 1.9–3.7)
Glucose, Bld: 98 mg/dL (ref 65–99)
Potassium: 4.8 mmol/L (ref 3.5–5.3)
Sodium: 139 mmol/L (ref 135–146)
Total Bilirubin: 0.6 mg/dL (ref 0.2–1.2)
Total Protein: 7.3 g/dL (ref 6.1–8.1)
eGFR: 108 mL/min/1.73m2 (ref >=60)

## 2024-08-05 NOTE — Progress Notes (Signed)
 CBC and CMP are stable.

## 2024-09-11 ENCOUNTER — Other Ambulatory Visit: Payer: Self-pay | Admitting: Internal Medicine

## 2024-09-11 DIAGNOSIS — Z79899 Other long term (current) drug therapy: Secondary | ICD-10-CM

## 2024-09-11 DIAGNOSIS — M0609 Rheumatoid arthritis without rheumatoid factor, multiple sites: Secondary | ICD-10-CM

## 2024-09-11 NOTE — Telephone Encounter (Signed)
 Last Fill: 04/08/2024  Labs: 08/04/2024  CBC and CMP are stable.   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   Okay to refill Simponi  Aria?

## 2024-09-22 ENCOUNTER — Telehealth: Payer: Self-pay

## 2024-09-22 NOTE — Telephone Encounter (Signed)
 Contacted patient and got her scheduled with [facility] for treatment options

## 2024-09-22 NOTE — Telephone Encounter (Signed)
 Please schedule with [facility] to discuss treatment options.

## 2024-09-26 ENCOUNTER — Telehealth: Payer: Self-pay | Admitting: Pharmacy Technician

## 2024-09-26 NOTE — Telephone Encounter (Signed)
 Hello,  Unfortunately there seems to have been a mix up with the delivery of her Simponi  Aria. [facility] has informed us  the medication was shipped to your office last week. If you have the medication and if it was stored correctly( in the refrigerator)  I can arrange for it to be picked up.  Otherwise we will need to cancel her appt. She has an upcoming appt on 09/29/24 and Im trying to avoid rescheduling her.  Thanks Luke

## 2024-09-27 NOTE — Telephone Encounter (Signed)
 Please check into this-patient will likely need to postpone infusion.

## 2024-09-27 NOTE — Telephone Encounter (Signed)
 Thanks,  I will f/u with [facility]ell.  Luke

## 2024-09-29 ENCOUNTER — Other Ambulatory Visit: Payer: Self-pay

## 2024-09-29 ENCOUNTER — Ambulatory Visit

## 2024-09-29 VITALS — BP 136/86 | HR 77 | Temp 97.9°F | Resp 12 | Ht 61.0 in | Wt 151.2 lb

## 2024-09-29 DIAGNOSIS — M0609 Rheumatoid arthritis without rheumatoid factor, multiple sites: Secondary | ICD-10-CM | POA: Diagnosis not present

## 2024-09-29 DIAGNOSIS — Z79899 Other long term (current) drug therapy: Secondary | ICD-10-CM

## 2024-09-29 MED ORDER — SODIUM CHLORIDE 0.9 % IV SOLN
2.0000 mg/kg | Freq: Once | INTRAVENOUS | Status: AC
  Administered 2024-09-29: 137.5 mg via INTRAVENOUS
  Filled 2024-09-29: qty 11

## 2024-09-29 MED ORDER — ACETAMINOPHEN 325 MG PO TABS
650.0000 mg | ORAL_TABLET | Freq: Once | ORAL | Status: AC
  Administered 2024-09-29: 650 mg via ORAL
  Filled 2024-09-29: qty 2

## 2024-09-29 MED ORDER — DIPHENHYDRAMINE HCL 25 MG PO CAPS
25.0000 mg | ORAL_CAPSULE | Freq: Once | ORAL | Status: AC
  Administered 2024-09-29: 25 mg via ORAL
  Filled 2024-09-29: qty 1

## 2024-09-29 NOTE — Progress Notes (Signed)
 Diagnosis: Rheumatoid Arthritis  Provider:  Mannam, Praveen MD  Procedure: IV Infusion  IV Type: Peripheral, IV Location: R Forearm  Simponi  Aria (Golimumab ), Dose: 137.5 mg  Infusion Start Time: 0910  Infusion Stop Time: 0940  Post Infusion IV Care: Peripheral IV Discontinued  Discharge: Condition: Stable, Destination: Home . AVS Declined  Performed by:  Rocky FORBES Sar, RN

## 2024-09-30 LAB — COMPREHENSIVE METABOLIC PANEL WITH GFR
AG Ratio: 1.5 (calc) (ref 1.0–2.5)
ALT: 7 U/L (ref 6–29)
AST: 15 U/L (ref 10–35)
Albumin: 4.1 g/dL (ref 3.6–5.1)
Alkaline phosphatase (APISO): 86 U/L (ref 37–153)
BUN: 13 mg/dL (ref 7–25)
CO2: 26 mmol/L (ref 20–32)
Calcium: 9.4 mg/dL (ref 8.6–10.4)
Chloride: 104 mmol/L (ref 98–110)
Creat: 0.63 mg/dL (ref 0.50–1.03)
Globulin: 2.8 g/dL (ref 1.9–3.7)
Glucose, Bld: 115 mg/dL — ABNORMAL HIGH (ref 65–99)
Potassium: 4.1 mmol/L (ref 3.5–5.3)
Sodium: 138 mmol/L (ref 135–146)
Total Bilirubin: 0.6 mg/dL (ref 0.2–1.2)
Total Protein: 6.9 g/dL (ref 6.1–8.1)
eGFR: 106 mL/min/1.73m2 (ref >=60)

## 2024-09-30 LAB — CBC WITH DIFFERENTIAL/PLATELET
Absolute Lymphocytes: 2512 {cells}/uL (ref 850–3900)
Absolute Monocytes: 524 {cells}/uL (ref 200–950)
Basophils Absolute: 87 {cells}/uL (ref 0–200)
Basophils Relative: 0.9 %
Eosinophils Absolute: 698 {cells}/uL — ABNORMAL HIGH (ref 15–500)
Eosinophils Relative: 7.2 %
HCT: 42.5 % (ref 35.9–46.0)
Hemoglobin: 14.2 g/dL (ref 11.7–15.5)
MCH: 29.6 pg (ref 27.0–33.0)
MCHC: 33.4 g/dL (ref 31.6–35.4)
MCV: 88.5 fL (ref 81.4–101.7)
MPV: 11 fL (ref 7.5–12.5)
Monocytes Relative: 5.4 %
Neutro Abs: 5878 {cells}/uL (ref 1500–7800)
Neutrophils Relative %: 60.6 %
Platelets: 362 Thousand/uL (ref 140–400)
RBC: 4.8 Million/uL (ref 3.80–5.10)
RDW: 12.6 % (ref 11.0–15.0)
Total Lymphocyte: 25.9 %
WBC: 9.7 Thousand/uL (ref 3.8–10.8)

## 2024-10-01 ENCOUNTER — Ambulatory Visit: Payer: Self-pay | Admitting: Rheumatology

## 2024-10-26 NOTE — Progress Notes (Signed)
 "  Office Visit Note  Patient: Sheena Rivera             Date of Birth: [birth year]           MRN: [id number]             PCP: Evangelina Tinnie Norris, PA-C Referring: Evangelina Tinnie Norris, PA-C Visit Date: 11/09/2024 Occupation: Data Unavailable  Subjective:  Discuss treatment options  History of Present Illness: Sheena Rivera is a 54 y.o. female with a past medical history of seronegative rheumatoid arthritis who presents today to discuss treatment alternatives.   Her current treatment regimen includes IV Simponi  Aria infusions every 8 weeks, Rasuvo  20 mg sq injections every 7 days, and folic acid  1 mg 2 tablets daily.  Patient is only receiving very temporary relief after receiving the Simponi  Aria infusion.  She continues to have recurrent effusions affecting both knees.  She is also been having significant pain and limited range of motion of the left shoulder as well as ongoing inflammation affecting the left wrist.  Her knee joint pain has been causing interrupted sleep at night and has been interfering with her quality of life.  Patient states that she had a fall in November and then another fall again on 10/07/2024.  Patient states that she rolled her ankle both times and ended up having an avulsion fracture when evaluated on 10/07/2024.  She is currently wearing a brace and the fracture is healing properly.  patient would like to discuss switching to Rinvoq.     Activities of Daily Living:  Patient reports morning stiffness for 1 hour.   Patient Reports nocturnal pain.  Difficulty dressing/grooming: Denies Difficulty climbing stairs: Reports Difficulty getting out of chair: Reports Difficulty using hands for taps, buttons, cutlery, and/or writing: Reports  Review of Systems  Constitutional:  Positive for fatigue.  HENT:  Positive for mouth dryness. Negative for mouth sores.   Eyes:  Positive for dryness.  Respiratory:  Negative for shortness of breath.   Cardiovascular:   Negative for chest pain and palpitations.  Gastrointestinal:  Negative for blood in stool, constipation and diarrhea.  Endocrine: Negative for increased urination.  Genitourinary:  Negative for involuntary urination.  Musculoskeletal:  Positive for joint pain, joint pain, joint swelling and morning stiffness. Negative for gait problem, myalgias, muscle weakness, muscle tenderness and myalgias.  Skin:  Positive for rash. Negative for color change, hair loss and sensitivity to sunlight.  Allergic/Immunologic: Negative for susceptible to infections.  Neurological:  Positive for headaches. Negative for dizziness.  Hematological:  Negative for swollen glands.  Psychiatric/Behavioral:  Positive for sleep disturbance. Negative for depressed mood. The patient is nervous/anxious.     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) 10/01/2016   High risk medication use 10/01/2016   Vitamin D  deficiency 10/01/2016   Plantar fasciitis 10/01/2016    Past Medical History:  Diagnosis Date   Arthritis    Lichen sclerosus 2025   dx by OBGYN per patient   Rheumatoid arthritis (HCC)     Family History  Problem Relation Age of Onset   Ovarian cancer Mother    COPD Father    Cancer - Lung Father    Depression Son    Past Surgical History:  Procedure Laterality Date   ABLATION     Social History[1] Social History   Social History Narrative  Not on file     Immunization History  Administered Date(s) Administered   Influenza-Unspecified 08/19/2014   Moderna Sars-Covid-2 Vaccination 11/14/2019, 12/12/2019     Objective: Vital Signs: BP 138/89   Pulse 96   Temp 98.4 F (36.9 C)   Resp 14   Ht 5' 1 (1.549 m)   Wt 151 lb 9.6 oz (68.8 kg)   BMI 28.64 kg/m    Physical Exam Vitals and nursing note reviewed.  Constitutional:      Appearance: She is  well-developed.  HENT:     Head: Normocephalic and atraumatic.  Eyes:     Conjunctiva/sclera: Conjunctivae normal.  Cardiovascular:     Rate and Rhythm: Normal rate and regular rhythm.     Heart sounds: Normal heart sounds.  Pulmonary:     Effort: Pulmonary effort is normal.     Breath sounds: Normal breath sounds.  Abdominal:     General: Bowel sounds are normal.     Palpations: Abdomen is soft.  Musculoskeletal:     Cervical back: Normal range of motion.  Lymphadenopathy:     Cervical: No cervical adenopathy.  Skin:    General: Skin is warm and dry.     Capillary Refill: Capillary refill takes less than 2 seconds.  Neurological:     Mental Status: She is alert and oriented to person, place, and time.  Psychiatric:        Behavior: Behavior normal.      Musculoskeletal Exam: C-spine has limited range of motion with lateral rotation.  Limited abduction of the left shoulder to 90 degrees.  Right shoulder has full range of motion.  Elbow joints have good range of motion with no tenderness along the joint line.  Tenderness and synovitis affecting the left wrist joint.  No synovitis of MCP joints.  Hip joints have good range of motion with no groin pain.  Moderate to large effusions affecting both knees.  Right ankle is in a brace.   CDAI Exam: CDAI Score: -- Patient Global: --; Provider Global: -- Swollen: 3 ; Tender: 4  Joint Exam 11/09/2024      Right  Left  Glenohumeral      Tender  Wrist     Swollen Tender  Knee  Swollen Tender  Swollen Tender     Investigation: No additional findings.  Imaging: No results found.  Recent Labs: Lab Results  Component Value Date   WBC 9.7 09/29/2024   HGB 14.2 09/29/2024   PLT 362 09/29/2024   NA 138 09/29/2024   K 4.1 09/29/2024   CL 104 09/29/2024   CO2 26 09/29/2024   GLUCOSE 115 (H) 09/29/2024   BUN 13 09/29/2024   CREATININE 0.63 09/29/2024   BILITOT 0.6 09/29/2024   ALKPHOS 91 06/25/2023   AST 15 09/29/2024   ALT  7 09/29/2024   PROT 6.9 09/29/2024   ALBUMIN 4.2 06/25/2023   CALCIUM 9.4 09/29/2024   GFRAA 120 02/06/2021   QFTBGOLD NEGATIVE 08/23/2017   QFTBGOLDPLUS NEGATIVE 06/25/2023    Speciality Comments: Oral MTX = caused nausea (2020)  Procedures:  No procedures performed Allergies: Patient has no known allergies.   Coronary calcium score 10/05/24: 0.    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  She has moderate to large effusions affecting both knees as well as pain and inflammation affecting the  left shoulder and left wrist joint.  She has been experiencing recurrent flares and an inadequate response to the current treatment regimen.  She is currently on Simponi  Aria infusions every 8 weeks and Rasuvo  20 mg sq injections once weekly.  She has not had any recent gaps in therapy.  The most recent infusion was administered on 09/29/2024.  She is currently healing from an avulsion fracture affecting the right ankle.  Discussed the option of proceeding with an IM Depo-Medrol injection or a steroid taper but we opted to hold off on steroid use to allow for complete healing of the fracture. Patient presented today to discuss treatment alternatives.  She is interested in initiating rinvoq after doing some of her own research.  Discussed other treatment alternatives including Orencia, IL-6 inhibitors, and the Jak inhibitors.  Reviewed indications, contraindications, potential side effects of rinvoq today in detail. Coronary calcium score 10/05/24: 0.  All questions were addressed and consent was obtained.  Highly recommended receiving the Shingrix vaccine prior to initiating rinvoq.  Plan to apply for rinvoq through insurance.  Okay to initiate rinvoq on or after 11/30/2024.  Counseled patient that Rinvoq is a JAK inhibitor indicated for  Rheumatoid Arthritis.  Counseled patient on purpose, proper use, and adverse effects of Rinvoq.    Reviewed the most common adverse effects including infection, diarrhea, headaches.  Also reviewed rare adverse effects such as bowel injury and the need to contact us  if they develop stomach pain during treatment. Counseled on the increase risk of venous thrombosis. Counseled about FDA black box warning of MACE (major adverse CV events including cardiovascular death, myocardial infarction, and stroke).  Reviewed with patient that there is the possibility of an increased risk of malignancy specifically lung cancer and lymphomas but it is not well understood if this increased risk is due to the medication or the disease state. Instructed patient that medication should be held for infection and prior to surgery.  Advised patient to avoid live vaccines. Recommend annual influenza, PCV 15 or PCV20 or Pneumovax 23, and Shingrix as indicated.    Reviewed importance of routine lab monitoring including lipid panel.  Will recheck lipid panel 3 months after starting and annually thereafter. CBC and CMP will be monitored routinely every 3 months. Standing orders placed. Provided patient with medication education material and answered all questions.  Patient consented to Rinvoq.  Will upload into patient's chart.  Will apply through patient's insurance and update when we receive a response.    Patient dose will be 15 mg daily.  Prescription will be sent to pharmacy pending lab results and insurance approval.  High risk medication use - Plan to apply for rinvoq 15 mg 1 tablet by mouth daily.  She will continue Rasuvo  20 mg sq injections every 7 days and folic acid  1 mg 2 tablets daily. CBC and CMP updated on 09/29/24.  Plan to update CBC and CMP in 1 month and every 3 months. TB gold order released today.   Lipid panel 08/14/24 Previous treatment: Sq Simponi  and simponi  aria.  No recent or recurrent infections.  Discussed  the importance of holding Rinvoq and Rasuvo  if she develops signs or symptoms of infection and to resume once the infection has completely cleared.- Plan: QuantiFERON-TB Gold Plus  Bilateral knee effusions: Patient presents today with moderate to large effusions affecting both knees.  She has been having recurrent flares affecting both knees.  She has difficulty ambulating at times.  She is also been experiencing increased  nocturnal pain in both knee joints.  Plan to switch the patient to rinvoq as discussed above.  Opted to hold off on a cortisone injection oral prednisone  at this time since she is currently healing from an avulsion fracture involving the right ankle.  Osteopenia of multiple sites - DEXA results from 09/08/2023: Right total hip BMD 0.705 with T-score -1.9.  No comparison available.  FRAX 10-year  major fx 7.7% and hip fracture is 0.8%. DEXA due in November 2026  Screening for tuberculosis -Order for TB gold released today.  Plan: QuantiFERON-TB Gold Plus  Other medical conditions are listed as follows:   Vitamin D  deficiency  History of tubal ligation  Arnold-Chiari malformation (HCC)  Dyslipidemia    Orders: Orders Placed This Encounter  Procedures   QuantiFERON-TB Gold Plus   No orders of the defined types were placed in this encounter.    Follow-Up Instructions: Return in about 6 weeks (around 12/21/2024) for Rheumatoid arthritis.   [facility]ell CHRISTELLA Craze, PA-C  Note - This record has been created using Dragon software.  Chart creation errors have been sought, but may not always  have been located. Such creation errors do not reflect on  the standard of medical care.     [1]  Social History Tobacco Use   Smoking status: Former    Current packs/day: 0.00    Average packs/day: 0.3 packs/day for 10.0 years (2.5 ttl pk-yrs)    Types: Cigarettes    Start date: 2006    Quit date: 2016    Years since quitting: 10.0    Passive exposure: Current   Smokeless  tobacco: Never  Vaping Use   Vaping status: Never Used  Substance Use Topics   Alcohol use: Yes    Comment: occasionally    Drug use: No   "

## 2024-11-09 ENCOUNTER — Encounter: Payer: Self-pay | Admitting: Physician Assistant

## 2024-11-09 ENCOUNTER — Ambulatory Visit: Attending: Physician Assistant | Admitting: Physician Assistant

## 2024-11-09 VITALS — BP 138/89 | HR 96 | Temp 98.4°F | Resp 14 | Ht 61.0 in | Wt 151.6 lb

## 2024-11-09 DIAGNOSIS — M25461 Effusion, right knee: Secondary | ICD-10-CM | POA: Diagnosis not present

## 2024-11-09 DIAGNOSIS — Z9851 Tubal ligation status: Secondary | ICD-10-CM | POA: Diagnosis not present

## 2024-11-09 DIAGNOSIS — E559 Vitamin D deficiency, unspecified: Secondary | ICD-10-CM

## 2024-11-09 DIAGNOSIS — E785 Hyperlipidemia, unspecified: Secondary | ICD-10-CM | POA: Diagnosis not present

## 2024-11-09 DIAGNOSIS — M25462 Effusion, left knee: Secondary | ICD-10-CM

## 2024-11-09 DIAGNOSIS — Q0700 Arnold-Chiari syndrome without spina bifida or hydrocephalus: Secondary | ICD-10-CM | POA: Diagnosis not present

## 2024-11-09 DIAGNOSIS — Z79899 Other long term (current) drug therapy: Secondary | ICD-10-CM

## 2024-11-09 DIAGNOSIS — M0609 Rheumatoid arthritis without rheumatoid factor, multiple sites: Secondary | ICD-10-CM | POA: Diagnosis not present

## 2024-11-09 DIAGNOSIS — Z111 Encounter for screening for respiratory tuberculosis: Secondary | ICD-10-CM

## 2024-11-09 DIAGNOSIS — M8589 Other specified disorders of bone density and structure, multiple sites: Secondary | ICD-10-CM

## 2024-11-09 NOTE — Patient Instructions (Signed)
 Standing Labs We placed an order today for your standing lab work.   Please have your standing labs drawn in 1 month then every 3 months   Please have your labs drawn 2 weeks prior to your appointment so that the provider can discuss your lab results at your appointment, if possible.  Please note that you may see your imaging and lab results in MyChart before we have reviewed them. We will contact you once all results are reviewed. Please allow our office up to 72 hours to thoroughly review all of the results before contacting the office for clarification of your results.  WALK-IN LAB HOURS  Monday through Thursday from 8:00 am - 4:30 pm and Friday from 8:00 am-12:00 pm.  Patients with office visits requiring labs will be seen before walk-in labs.  You may encounter longer than normal wait times. Please allow additional time. Wait times may be shorter on  Monday and Thursday afternoons.  We do not book appointments for walk-in labs. We appreciate your patience and understanding with our staff.   Labs are drawn by [facility]. Please bring your co-pay at the time of your lab draw.  You may receive a bill from [facility] for your lab work.  Please note if you are on Hydroxychloroquine and and an order has been placed for a Hydroxychloroquine level,  you will need to have it drawn 4 hours or more after your last dose.  If you wish to have your labs drawn at another location, please call the office 24 hours in advance so we can fax the orders.  The office is located at [address], [address], [place], [place] [address]   If you have any questions regarding directions or hours of operation,  please call [phone].   As a reminder, please drink plenty of water prior to coming for your lab work. Thanks! Upadacitinib Extended-Release Tablets What is this medication? UPADACITINIB (ue PAD a SYE ti nib) treats autoimmune conditions, such as arthritis, eczema, and ulcerative colitis. It is often used  when other medications have not worked well enough or cannot be tolerated. It works by slowing down an overactive immune system. This decreases inflammation. This medicine may be used for other purposes; ask your health care provider or pharmacist if you have questions. COMMON BRAND NAME(S): RINVOQ What should I tell my care team before I take this medication? They need to know if you have any of these conditions: Cancer Current or past tobacco use Diabetes Have had a heart attack or stroke Have had blood clots Have been in close contact with someone who has tuberculosis (TB) Heart disease High blood pressure High cholesterol HIV or AIDS Infection or have had an infection that does not go away, such as tuberculosis (TB), shingles, hepatitis, herpes Kidney disease Live or have traveled to the [facility] [place]  or the [place]  or [place] Liver disease Low blood cell levels (white cells, red cells, and platelets) Lung or breathing disease, such as asthma or COPD Recent or upcoming vaccine Stomach or intestine problems Taking NSAIDs, medications for pain and inflammation, such as ibuprofen or naproxen Taking steroid medications, such as prednisone  or cortisone Weakened immune system An unusual or allergic reaction to upadacitinib, other medications, foods, dyes, or preservatives Pregnant or trying to get pregnant Breastfeeding How should I use this medication? Take this medication by mouth with water. Take it as directed on the prescription label at the same time every day. Do not cut, crush, or chew this  medication. Swallow the tablets whole. You can take it with or without food. If it upsets your stomach, take it with food. Keep taking it unless your care team tells you to stop. Do not take this medication with grapefruit juice. A special MedGuide will be given to you by the pharmacist with each prescription and refill. Be sure to read this information carefully each  time. Talk to your care team about the use of this medication in children. While it may be prescribed for children as young as 2 years for selected conditions, precautions do apply. Overdosage: If you think you have taken too much of this medicine contact a poison control center or emergency room at once. NOTE: This medicine is only for you. Do not share this medicine with others. What if I miss a dose? If you miss a dose, take it as soon as you can. If it is almost time for your next dose, take only that dose. Do not take double or extra doses. What may interact with this medication? Certain antivirals for HIV or hepatitis Certain medications for fungal infections, such as ketoconazole, itraconazole, posaconazole, voriconazole Certain medications for seizures, such as carbamazepine, phenobarbital, phenytoin Clarithromycin Grapefruit and grapefruit juice Live virus vaccines Medications that lower your chance of fighting infection, such as abatacept, adalimumab , azathioprine, cyclosporine, etanercept, infliximab, rituximab Rifampin Supplements, such as St. John's wort Other medications may affect the way this medication works. Talk with your care team about all of the medications you take. They may suggest changes to your treatment plan to lower the risk of side effects and to make sure your medications work as intended. This list may not describe all possible interactions. Give your health care provider a list of all the medicines, herbs, non-prescription drugs, or dietary supplements you use. Also tell them if you smoke, drink alcohol, or use illegal drugs. Some items may interact with your medicine. What should I watch for while using this medication? Visit your care team for regular checks on your progress. Tell your care team if your symptoms do not start to get better or if they get worse. You may need blood work done while you are taking this medication. This medication may increase your  risk of getting an infection. Call your care team for advice if you get a fever, chills, sore throat, or other symptoms of a cold or flu. Do not treat yourself. Try to avoid being around people who are sick. Your care team will screen you for tuberculosis (TB) before you start this medication. If they think you are at risk, you may be treated with medication for TB. You should start taking the medication for TB before you start this medication. Make sure to finish the full course of TB medication. Talk to your care team about your vaccination history. To lower your risk of infection, you may need certain vaccines before you start this medication. Talk to your care team about your risk of cancer. You may be more at risk for certain types of cancer if you take this medication. Tobacco use may increase your risk of cancer. Talk to your care team about having your skin checked for cancer while taking this medication. Limit the amount of time you spend in the sun. Wear protective clothing and sunscreen when you are in the sun. Do not use sun lamps, tanning beds, or tanning booths. This medication may increase the risk of blood clots, heart attack, stroke, or death. High blood pressure, high cholesterol, diabetes,  increased age, excess weight, and tobacco use increase this risk. Call emergency services right away if you have symptoms of a heart attack or stroke. This medication can increase bad cholesterol and fats (such as LDL, triglycerides) and decrease good cholesterol (HDL) in your blood. You may need blood tests to check your cholesterol. Ask your care team what you can do to lower your risk of high cholesterol while taking this medication. Tell your care team right away if you have any change in your eyesight. Talk to your care team if you often see part of the tablet in your stool. Talk to your care team if you may be pregnant. Serious birth defects can occur if you take this medication during pregnancy  and for 4 weeks after the last dose. You will need a negative pregnancy test before starting this medication. Contraception is recommended while taking this medication and for 4 weeks after the last dose. Your care team can help you find the option that works for you. Do not breastfeed while taking this medication and for 6 days after the last dose. What side effects may I notice from receiving this medication? Side effects that you should report to your care team as soon as possible: Allergic reactions--skin rash, itching, hives, swelling of the face, lips, tongue, or throat Blood clot--pain, swelling, or warmth in the leg, shortness of breath, chest pain Change in vision Heart attack--pain or tightness in the chest, shoulders, arms, or jaw, nausea, shortness of breath, cold or clammy skin, feeling faint or lightheaded Infection--fever, chills, cough, sore throat, wounds that don't heal, pain or trouble when passing urine, general feeling of discomfort or being unwell Liver injury--right upper belly pain, loss of appetite, nausea, light-colored stool, dark yellow or brown urine, yellowing skin or eyes, unusual weakness or fatigue Low red blood cell level--unusual weakness or fatigue, dizziness, headache, trouble breathing Stomach pain that is severe, does not go away, or gets worse Stroke--sudden numbness or weakness of the face, arm, or leg, trouble speaking, confusion, trouble walking, loss of balance or coordination, dizziness, severe headache, change in vision Side effects that usually do not require medical attention (report these to your care team if they continue or are bothersome): Acne Cough Headache Nausea Runny or stuffy nose This list may not describe all possible side effects. Call your doctor for medical advice about side effects. You may report side effects to FDA at 1-800-FDA-1088. Where should I keep my medication? Keep out of the reach of children and pets. Store at room  temperature between 20 and 25 degrees C (68 and 77 degrees F). Protect from moisture. Keep the container tightly closed. Keep this medication in the original container until you are ready to take it. Get rid of any unused medication after the expiration date. To get rid of medications that are no longer needed or have expired: Take the medication to a medication take-back program. Check with your pharmacy or law enforcement to find a location. If you cannot return the medication, check the label or package insert to see if the medication should be thrown out in the garbage or flushed down the toilet. If you are not sure, ask your care team. If it is safe to put it in the trash, empty the medication out of the container. Mix the medication with cat litter, dirt, coffee grounds, or other unwanted substance. Seal the mixture in a bag or container. Put it in the trash. NOTE: This sheet is a summary. It may  not cover all possible information. If you have questions about this medicine, talk to your doctor, pharmacist, or health care provider.  2024 Elsevier/Gold Standard (2023-09-17 00:00:00)

## 2024-11-11 LAB — QUANTIFERON-TB GOLD PLUS
Mitogen-NIL: 7.7 [IU]/mL
NIL: 0.01 [IU]/mL
QuantiFERON-TB Gold Plus: NEGATIVE
TB1-NIL: 0 [IU]/mL
TB2-NIL: 0 [IU]/mL

## 2024-11-13 ENCOUNTER — Ambulatory Visit: Payer: Self-pay | Admitting: Physician Assistant

## 2024-11-13 NOTE — Progress Notes (Signed)
 TB gold negative

## 2024-11-20 ENCOUNTER — Encounter: Payer: Self-pay | Admitting: Rheumatology

## 2024-11-21 NOTE — Telephone Encounter (Signed)
 Ok to cancel infusion--ok to initiate rinvoq  on or after 11/24/24.

## 2024-11-22 ENCOUNTER — Telehealth: Payer: Self-pay

## 2024-11-22 ENCOUNTER — Other Ambulatory Visit (HOSPITAL_COMMUNITY): Payer: Self-pay

## 2024-11-22 DIAGNOSIS — M0609 Rheumatoid arthritis without rheumatoid factor, multiple sites: Secondary | ICD-10-CM

## 2024-11-22 MED ORDER — RINVOQ 15 MG PO TB24: 15.0000 mg | ORAL_TABLET | Freq: Every day | ORAL | 0 refills | Status: AC

## 2024-11-22 NOTE — Telephone Encounter (Signed)
 Received notification from [employer] regarding a prior authorization for RINVOQ . Authorization has been APPROVED from 11/22/2024 to 05/22/2025. Approval letter sent to scan center.  Patient must fill through [facility]: [phone]   Authorization # [id number]  Patient enrolled into Rinvoq  copay card via [employer] portal: ID: [id number] Issued: 11/22/2024 Group: [id number] BIN: [id number] [id number]  Sherry Pennant, PharmD, MPH, BCPS, CPP Clinical Pharmacist

## 2024-11-22 NOTE — Telephone Encounter (Signed)
 Patient will be Rinvoq  new start  Submitted a Prior Authorization request to [employer] for RINVOQ  via CoverMyMeds. Will update once we receive a response.  Key: [id number]

## 2024-11-24 ENCOUNTER — Ambulatory Visit

## 2024-12-04 ENCOUNTER — Ambulatory Visit: Admitting: Rheumatology

## 2024-12-26 ENCOUNTER — Ambulatory Visit: Admitting: Rheumatology
